# Patient Record
Sex: Female | Born: 1990 | Race: Black or African American | Hispanic: No | Smoking: Never smoker
Health system: Southern US, Community
[De-identification: ages and names within clinical notes are randomized; demographics above are authoritative.]

## PROBLEM LIST (undated history)

## (undated) ENCOUNTER — Emergency Department (HOSPITAL_COMMUNITY): Payer: Medicaid Other

## (undated) ENCOUNTER — Inpatient Hospital Stay (HOSPITAL_COMMUNITY): Payer: Self-pay

## (undated) DIAGNOSIS — D573 Sickle-cell trait: Secondary | ICD-10-CM

## (undated) DIAGNOSIS — F419 Anxiety disorder, unspecified: Secondary | ICD-10-CM

## (undated) DIAGNOSIS — F329 Major depressive disorder, single episode, unspecified: Secondary | ICD-10-CM

## (undated) DIAGNOSIS — F4310 Post-traumatic stress disorder, unspecified: Secondary | ICD-10-CM

## (undated) DIAGNOSIS — F99 Mental disorder, not otherwise specified: Secondary | ICD-10-CM

## (undated) DIAGNOSIS — F32A Depression, unspecified: Secondary | ICD-10-CM

## (undated) HISTORY — PX: NO PAST SURGERIES: SHX2092

## (undated) HISTORY — DX: Post-traumatic stress disorder, unspecified: F43.10

## (undated) HISTORY — PX: WISDOM TOOTH EXTRACTION: SHX21

---
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
## 2011-01-11 ENCOUNTER — Emergency Department (HOSPITAL_COMMUNITY)
Admission: EM | Admit: 2011-01-11 | Discharge: 2011-01-11 | Discharge status: Against Medical Advice | Payer: Self-pay | Attending: Emergency Medicine | Admitting: Emergency Medicine

## 2011-01-11 DIAGNOSIS — R6883 Chills (without fever): Secondary | ICD-10-CM | POA: Insufficient documentation

## 2011-01-11 DIAGNOSIS — R51 Headache: Secondary | ICD-10-CM | POA: Insufficient documentation

## 2011-02-24 ENCOUNTER — Inpatient Hospital Stay (HOSPITAL_COMMUNITY)
Admission: AD | Admit: 2011-02-24 | Discharge: 2011-02-24 | Disposition: A | Discharge status: Home / Self Care | Payer: Medicaid Other | Source: Ambulatory Visit | Attending: Obstetrics & Gynecology | Admitting: Obstetrics & Gynecology

## 2011-02-24 ENCOUNTER — Inpatient Hospital Stay (HOSPITAL_COMMUNITY): Payer: Medicaid Other

## 2011-02-24 ENCOUNTER — Inpatient Hospital Stay (INDEPENDENT_AMBULATORY_CARE_PROVIDER_SITE_OTHER)
Admission: RE | Admit: 2011-02-24 | Discharge: 2011-02-24 | Disposition: A | Discharge status: Home / Self Care | Payer: Self-pay | Source: Ambulatory Visit | Attending: Family Medicine | Admitting: Family Medicine

## 2011-02-24 DIAGNOSIS — O210 Mild hyperemesis gravidarum: Secondary | ICD-10-CM | POA: Insufficient documentation

## 2011-02-24 DIAGNOSIS — O9989 Other specified diseases and conditions complicating pregnancy, childbirth and the puerperium: Secondary | ICD-10-CM

## 2011-02-24 DIAGNOSIS — O99891 Other specified diseases and conditions complicating pregnancy: Secondary | ICD-10-CM | POA: Insufficient documentation

## 2011-02-24 DIAGNOSIS — Z331 Pregnant state, incidental: Secondary | ICD-10-CM

## 2011-02-24 DIAGNOSIS — R109 Unspecified abdominal pain: Secondary | ICD-10-CM

## 2011-02-24 LAB — WET PREP, GENITAL
Trich, Wet Prep: NONE SEEN
Yeast Wet Prep HPF POC: NONE SEEN

## 2011-02-24 LAB — DIFFERENTIAL
Basophils Absolute: 0 10*3/uL (ref 0.0–0.1)
Basophils Relative: 0 % (ref 0–1)
Eosinophils Absolute: 0.1 10*3/uL (ref 0.0–0.7)
Eosinophils Relative: 1 % (ref 0–5)
Lymphocytes Relative: 27 % (ref 12–46)
Lymphs Abs: 2.6 10*3/uL (ref 0.7–4.0)
Monocytes Absolute: 0.9 10*3/uL (ref 0.1–1.0)
Monocytes Relative: 9 % (ref 3–12)
Neutro Abs: 6.2 10*3/uL (ref 1.7–7.7)
Neutrophils Relative %: 64 % (ref 43–77)

## 2011-02-24 LAB — CBC
Platelets: 252 10*3/uL (ref 150–400)
RBC: 4.47 MIL/uL (ref 3.87–5.11)
WBC: 9.8 10*3/uL (ref 4.0–10.5)

## 2011-02-24 LAB — POCT URINALYSIS DIP (DEVICE)
Glucose, UA: NEGATIVE mg/dL
Hgb urine dipstick: NEGATIVE
Ketones, ur: >=160 mg/dL — AB
Nitrite: NEGATIVE
Protein, ur: 30 mg/dL — AB
Specific Gravity, Urine: >=1.03 (ref 1.005–1.030)
Urobilinogen, UA: 1 mg/dL (ref 0.0–1.0)
pH: 6.5 (ref 5.0–8.0)

## 2011-02-24 LAB — POCT PREGNANCY, URINE: Preg Test, Ur: POSITIVE

## 2011-03-02 ENCOUNTER — Inpatient Hospital Stay (HOSPITAL_COMMUNITY)
Admission: AD | Admit: 2011-03-02 | Discharge: 2011-03-02 | Disposition: A | Discharge status: Home / Self Care | Payer: Medicaid Other | Source: Ambulatory Visit | Attending: Obstetrics & Gynecology | Admitting: Obstetrics & Gynecology

## 2011-03-02 DIAGNOSIS — O210 Mild hyperemesis gravidarum: Secondary | ICD-10-CM

## 2011-03-02 LAB — URINE MICROSCOPIC-ADD ON

## 2011-03-02 LAB — COMPREHENSIVE METABOLIC PANEL
AST: 16 U/L (ref 0–37)
Albumin: 3.8 g/dL (ref 3.5–5.2)
Chloride: 99 mEq/L (ref 96–112)
Creatinine, Ser: 0.73 mg/dL (ref 0.4–1.2)
GFR calc Af Amer: >60 mL/min (ref >60)
Potassium: 4 mEq/L (ref 3.5–5.1)
Total Bilirubin: 0.4 mg/dL (ref 0.3–1.2)
Total Protein: 7.4 g/dL (ref 6.0–8.3)

## 2011-03-02 LAB — URINALYSIS, ROUTINE W REFLEX MICROSCOPIC
Hgb urine dipstick: NEGATIVE
Specific Gravity, Urine: >1.03 — ABNORMAL HIGH (ref 1.005–1.030)
pH: 6 (ref 5.0–8.0)

## 2011-03-02 LAB — HIV ANTIBODY (ROUTINE TESTING W REFLEX): HIV: NONREACTIVE

## 2011-03-02 LAB — CBC
MCH: 29.6 pg (ref 26.0–34.0)
Platelets: 238 10*3/uL (ref 150–400)
RBC: 4.49 MIL/uL (ref 3.87–5.11)
RDW: 12.9 % (ref 11.5–15.5)
WBC: 7.1 10*3/uL (ref 4.0–10.5)

## 2011-03-22 ENCOUNTER — Emergency Department (HOSPITAL_COMMUNITY)
Admission: EM | Admit: 2011-03-22 | Discharge: 2011-03-22 | Disposition: A | Discharge status: Home / Self Care | Payer: Medicaid Other | Attending: Emergency Medicine | Admitting: Emergency Medicine

## 2011-03-22 DIAGNOSIS — M545 Low back pain, unspecified: Secondary | ICD-10-CM | POA: Insufficient documentation

## 2011-03-22 DIAGNOSIS — O99891 Other specified diseases and conditions complicating pregnancy: Secondary | ICD-10-CM | POA: Insufficient documentation

## 2011-03-22 LAB — URINALYSIS, ROUTINE W REFLEX MICROSCOPIC
Bilirubin Urine: NEGATIVE
Glucose, UA: NEGATIVE mg/dL
Hgb urine dipstick: NEGATIVE
Specific Gravity, Urine: 1.02 (ref 1.005–1.030)
Urobilinogen, UA: 0.2 mg/dL (ref 0.0–1.0)

## 2011-04-16 LAB — ABO/RH

## 2011-04-16 LAB — STREP B DNA PROBE: GBS: NEGATIVE

## 2011-04-16 LAB — RUBELLA ANTIBODY, IGM: Rubella: IMMUNE

## 2011-08-06 ENCOUNTER — Encounter (HOSPITAL_COMMUNITY): Payer: Self-pay | Admitting: *Deleted

## 2011-08-06 ENCOUNTER — Inpatient Hospital Stay (HOSPITAL_COMMUNITY)
Admission: AD | Admit: 2011-08-06 | Discharge: 2011-08-06 | Disposition: A | Discharge status: Home / Self Care | Payer: Medicaid Other | Source: Ambulatory Visit | Attending: Obstetrics & Gynecology | Admitting: Obstetrics & Gynecology

## 2011-08-06 ENCOUNTER — Inpatient Hospital Stay (HOSPITAL_COMMUNITY): Payer: Medicaid Other

## 2011-08-06 DIAGNOSIS — R102 Pelvic and perineal pain: Secondary | ICD-10-CM

## 2011-08-06 DIAGNOSIS — N949 Unspecified condition associated with female genital organs and menstrual cycle: Secondary | ICD-10-CM

## 2011-08-06 DIAGNOSIS — O99891 Other specified diseases and conditions complicating pregnancy: Secondary | ICD-10-CM | POA: Insufficient documentation

## 2011-08-06 DIAGNOSIS — O26899 Other specified pregnancy related conditions, unspecified trimester: Secondary | ICD-10-CM

## 2011-08-06 HISTORY — DX: Anxiety disorder, unspecified: F41.9

## 2011-08-06 HISTORY — DX: Mental disorder, not otherwise specified: F99

## 2011-08-06 LAB — URINALYSIS, ROUTINE W REFLEX MICROSCOPIC
Glucose, UA: NEGATIVE mg/dL
Hgb urine dipstick: NEGATIVE
Leukocytes, UA: NEGATIVE
Specific Gravity, Urine: 1.015 (ref 1.005–1.030)
pH: 6.5 (ref 5.0–8.0)

## 2011-08-06 NOTE — ED Provider Notes (Signed)
History     Chief Complaint  Patient presents with  . Laboring   HPI 20 y.o. G1P0 at [redacted]w[redacted]d c/o pelvic pressure and sharp suprapubic/vaginal pain x 5 days. + fetal movement. No vaginal bleeding or LOF. Uncomplicated prenatal course.     Past Medical History  Diagnosis Date  . Anxiety   . Mental disorder     depression    Past Surgical History  Procedure Date  . No past surgeries     No family history on file.  History  Substance Use Topics  . Smoking status: Never Smoker   . Smokeless tobacco: Not on file  . Alcohol Use: No    Allergies: No Known Allergies  Prescriptions prior to admission  Medication Sig Dispense Refill  . escitalopram (LEXAPRO) 20 MG tablet Take 20 mg by mouth daily.        . fluconazole (DIFLUCAN) 150 MG tablet Take 150 mg by mouth once.        . nystatin-triamcinolone (MYCOLOG II) cream Apply 1 application topically at bedtime.        . ondansetron (ZOFRAN) 8 MG tablet Take by mouth every 8 (eight) hours as needed. For nausea       . prenatal vitamin w/FE, FA (PRENATAL 1 + 1) 27-1 MG TABS Take 1 tablet by mouth daily.          Review of Systems  Constitutional: Negative.   Respiratory: Negative.   Cardiovascular: Negative.   Gastrointestinal: Positive for abdominal pain. Negative for nausea, vomiting, diarrhea and constipation.  Genitourinary: Negative for dysuria, urgency, frequency, hematuria and flank pain.       Negative for vaginal bleeding, cramping/contractions  Musculoskeletal: Negative.   Neurological: Negative.   Psychiatric/Behavioral: Negative.    Physical Exam   Blood pressure 120/65, pulse 88, temperature 98.2 F (36.8 C), temperature source Oral, resp. rate 20, height 5\' 5"  (1.651 m), weight 78.019 kg (172 lb), SpO2 99.00%.  Physical Exam  Nursing note and vitals reviewed. Constitutional: She is oriented to person, place, and time. She appears well-developed and well-nourished. No distress.  Cardiovascular: Normal rate.    Respiratory: Effort normal.  GI: Soft. There is no tenderness.  Genitourinary: No bleeding around the vagina. Vaginal discharge (white, creamy, nonmalodorous) found.       SVE: closed/thick/high  Musculoskeletal: Normal range of motion.  Neurological: She is alert and oriented to person, place, and time.  Skin: Skin is warm and dry.  Psychiatric: She has a normal mood and affect.   EFM: Baseline:150  Variability:mod Accels:none Decels:none  Toco:quiet  MAU Course  Procedures  Results for orders placed during the hospital encounter of 08/06/11 (from the past 24 hour(s))  URINALYSIS, ROUTINE W REFLEX MICROSCOPIC     Status: Normal   Collection Time   08/06/11  8:05 PM      Component Value Range   Color, Urine YELLOW  YELLOW    Appearance CLEAR  CLEAR    Specific Gravity, Urine 1.015  1.005 - 1.030    pH 6.5  5.0 - 8.0    Glucose, UA NEGATIVE  NEGATIVE (mg/dL)   Hgb urine dipstick NEGATIVE  NEGATIVE    Bilirubin Urine NEGATIVE  NEGATIVE    Ketones, ur NEGATIVE  NEGATIVE (mg/dL)   Protein, ur NEGATIVE  NEGATIVE (mg/dL)   Urobilinogen, UA 1.0  0.0 - 1.0 (mg/dL)   Nitrite NEGATIVE  NEGATIVE    Leukocytes, UA NEGATIVE  NEGATIVE    BPP 8/8  Assessment and  Plan  20 y.o. G1P0 at [redacted]w[redacted]d Pelvic pain F/U as scheduled, precautions rev'd   [name] 08/06/2011, 8:58 PM

## 2011-08-06 NOTE — Progress Notes (Signed)
Pt states sthat she has apd pain that comes and goes in the back and in the front-present since Sunday

## 2011-08-06 NOTE — Progress Notes (Signed)
Pt in c/o pelvic pressure since Sunday, worse today.  Reports sharp pains in lower abdomen since Sunday, worse today.  Was seen at office on Monday, being treated for a yeast infection.  Denies any bleeding or leaking of fluid.  Decreased fetal movement today.  Did not have cervix checked on office on Monday.

## 2011-08-07 NOTE — ED Provider Notes (Signed)
Agree with above note.  LEGGETT,KELLY H. 08/07/2011 6:33 AM  

## 2011-10-11 ENCOUNTER — Encounter (HOSPITAL_COMMUNITY): Payer: Self-pay | Admitting: *Deleted

## 2011-10-11 ENCOUNTER — Inpatient Hospital Stay (HOSPITAL_COMMUNITY)
Admission: AD | Admit: 2011-10-11 | Discharge: 2011-10-11 | Disposition: A | Discharge status: Home / Self Care | Payer: Medicaid Other | Source: Ambulatory Visit | Attending: Obstetrics & Gynecology | Admitting: Obstetrics & Gynecology

## 2011-10-11 DIAGNOSIS — R109 Unspecified abdominal pain: Secondary | ICD-10-CM | POA: Insufficient documentation

## 2011-10-11 DIAGNOSIS — O4690 Antepartum hemorrhage, unspecified, unspecified trimester: Secondary | ICD-10-CM | POA: Insufficient documentation

## 2011-10-11 HISTORY — DX: Major depressive disorder, single episode, unspecified: F32.9

## 2011-10-11 HISTORY — DX: Depression, unspecified: F32.A

## 2011-10-12 ENCOUNTER — Encounter (HOSPITAL_COMMUNITY): Payer: Self-pay | Admitting: Anesthesiology

## 2011-10-12 ENCOUNTER — Inpatient Hospital Stay (HOSPITAL_COMMUNITY): Payer: Medicaid Other | Admitting: Anesthesiology

## 2011-10-12 ENCOUNTER — Encounter (HOSPITAL_COMMUNITY): Payer: Self-pay | Admitting: *Deleted

## 2011-10-12 ENCOUNTER — Inpatient Hospital Stay (HOSPITAL_COMMUNITY)
Admission: AD | Admit: 2011-10-12 | Discharge: 2011-10-15 | DRG: 775 | Disposition: A | Discharge status: Home / Self Care | Payer: Medicaid Other | Source: Ambulatory Visit | Attending: Obstetrics & Gynecology | Admitting: Obstetrics & Gynecology

## 2011-10-12 DIAGNOSIS — F329 Major depressive disorder, single episode, unspecified: Secondary | ICD-10-CM

## 2011-10-12 DIAGNOSIS — O99344 Other mental disorders complicating childbirth: Secondary | ICD-10-CM

## 2011-10-12 DIAGNOSIS — F1290 Cannabis use, unspecified, uncomplicated: Secondary | ICD-10-CM | POA: Clinically undetermined

## 2011-10-12 DIAGNOSIS — Z7289 Other problems related to lifestyle: Secondary | ICD-10-CM | POA: Diagnosis not present

## 2011-10-12 LAB — CBC
Hemoglobin: 11.7 g/dL — ABNORMAL LOW (ref 12.0–15.0)
MCH: 30.2 pg (ref 26.0–34.0)
Platelets: 176 10*3/uL (ref 150–400)
RBC: 3.88 MIL/uL (ref 3.87–5.11)
WBC: 7.6 10*3/uL (ref 4.0–10.5)

## 2011-10-12 MED ORDER — LACTATED RINGERS IV SOLN: 500.0000 mL | INTRAVENOUS | Status: DC | PRN

## 2011-10-12 MED ORDER — FENTANYL 2.5 MCG/ML BUPIVACAINE 1/10 % EPIDURAL INFUSION (WH - ANES)
INTRAMUSCULAR | Status: DC | PRN
  Administered 2011-10-12: 14 mL/h via EPIDURAL

## 2011-10-12 MED ORDER — PHENYLEPHRINE 40 MCG/ML (10ML) SYRINGE FOR IV PUSH (FOR BLOOD PRESSURE SUPPORT)
80.0000 ug | PREFILLED_SYRINGE | INTRAVENOUS | Status: DC | PRN
  Filled 2011-10-12: qty 5

## 2011-10-12 MED ORDER — ACETAMINOPHEN 325 MG PO TABS: 650.0000 mg | ORAL_TABLET | ORAL | Status: DC | PRN

## 2011-10-12 MED ORDER — FENTANYL 2.5 MCG/ML BUPIVACAINE 1/10 % EPIDURAL INFUSION (WH - ANES)
14.0000 mL/h | INTRAMUSCULAR | Status: DC
  Administered 2011-10-13 (×4): 14 mL/h via EPIDURAL
  Filled 2011-10-12 (×5): qty 60

## 2011-10-12 MED ORDER — HYDROXYZINE HCL 50 MG PO TABS: 50.0000 mg | ORAL_TABLET | Freq: Four times a day (QID) | ORAL | Status: DC | PRN

## 2011-10-12 MED ORDER — OXYTOCIN BOLUS FROM INFUSION
500.0000 mL | Freq: Once | INTRAVENOUS | Status: DC
  Filled 2011-10-12: qty 500

## 2011-10-12 MED ORDER — IBUPROFEN 600 MG PO TABS: 600.0000 mg | ORAL_TABLET | Freq: Four times a day (QID) | ORAL | Status: DC | PRN

## 2011-10-12 MED ORDER — DIPHENHYDRAMINE HCL 50 MG/ML IJ SOLN: 12.5000 mg | INTRAMUSCULAR | Status: DC | PRN

## 2011-10-12 MED ORDER — OXYTOCIN 20 UNITS IN LACTATED RINGERS INFUSION - SIMPLE: 125.0000 mL/h | Freq: Once | INTRAVENOUS | Status: DC

## 2011-10-12 MED ORDER — LACTATED RINGERS IV SOLN: 500.0000 mL | Freq: Once | INTRAVENOUS | Status: DC

## 2011-10-12 MED ORDER — BUTORPHANOL TARTRATE 2 MG/ML IJ SOLN: 1.0000 mg | INTRAMUSCULAR | Status: DC | PRN

## 2011-10-12 MED ORDER — EPHEDRINE 5 MG/ML INJ
10.0000 mg | INTRAVENOUS | Status: DC | PRN
  Filled 2011-10-12: qty 4

## 2011-10-12 MED ORDER — SODIUM BICARBONATE 8.4 % IV SOLN
INTRAVENOUS | Status: DC | PRN
  Administered 2011-10-12: 4 mL via EPIDURAL

## 2011-10-12 MED ORDER — ONDANSETRON HCL 4 MG/2ML IJ SOLN: 4.0000 mg | Freq: Four times a day (QID) | INTRAMUSCULAR | Status: DC | PRN

## 2011-10-12 MED ORDER — OXYCODONE-ACETAMINOPHEN 5-325 MG PO TABS: 2.0000 | ORAL_TABLET | ORAL | Status: DC | PRN

## 2011-10-12 MED ORDER — PHENYLEPHRINE 40 MCG/ML (10ML) SYRINGE FOR IV PUSH (FOR BLOOD PRESSURE SUPPORT): 80.0000 ug | PREFILLED_SYRINGE | INTRAVENOUS | Status: DC | PRN

## 2011-10-12 MED ORDER — LIDOCAINE HCL (PF) 1 % IJ SOLN: 30.0000 mL | INTRAMUSCULAR | Status: DC | PRN

## 2011-10-12 MED ORDER — LACTATED RINGERS IV SOLN
INTRAVENOUS | Status: DC
  Administered 2011-10-13 (×2): via INTRAVENOUS

## 2011-10-12 MED ORDER — HYDROXYZINE HCL 50 MG/ML IM SOLN: 50.0000 mg | Freq: Four times a day (QID) | INTRAMUSCULAR | Status: DC | PRN

## 2011-10-12 MED ORDER — EPHEDRINE 5 MG/ML INJ: 10.0000 mg | INTRAVENOUS | Status: DC | PRN

## 2011-10-12 MED ORDER — CITRIC ACID-SODIUM CITRATE 334-500 MG/5ML PO SOLN: 30.0000 mL | ORAL | Status: DC | PRN

## 2011-10-12 NOTE — ED Notes (Signed)
Called D. Poe CNM about pts arrival for labor check. She asked that I check pt.

## 2011-10-12 NOTE — H&P (Signed)
Obstetric History and Physical  [name] is a 20 y.o. female G1P0 with IUP at [redacted]w[redacted]d presenting for early labor. Pt states she has been having irregular, every 1-5 minutes contractions, scant vaginal bleeding, intact membranes, with active fetal movement.    Prenatal Course Source of Care:[facility]  with onset of care at 12 weeks Pregnancy complications or risks: Depression, on Lexapro  She desires to oral contraceptives (estrogen/progesterone).  She plans to breastfeed She plans outpatient circumcision for her female infant  Prenatal labs and studies: ABO, Rh:  A+ Antibody:  neg Rubella:  immune RPR:   NR HBsAg:   neg HIV:   neg GBS:   neg 2 hr Glucola normal Genetic screeningnormal Anatomy US normal  Past Medical History:  Past Medical History  Diagnosis Date  . Mental disorder     depression  . Depression     Past Surgical History:  Past Surgical History  Procedure Date  . No past surgeries     Obstetrical History:  OB History    Grav Para Term Preterm Abortions TAB SAB Ect Mult Living   1               Gynecological History: Noncontributory  Social History:  History   Social History  . Marital Status: Single    Spouse Name: N/A    Number of Children: N/A  . Years of Education: N/A   Social History Main Topics  . Smoking status: Never Smoker   . Smokeless tobacco: None  . Alcohol Use: No  . Drug Use: No  . Sexually Active: Yes    Birth Control/ Protection: None   Other Topics Concern  . None   Social History Narrative  . None    Family History:  Family History  Problem Relation Age of Onset  . Anesthesia problems Neg Hx   . Hypotension Neg Hx   . Malignant hyperthermia Neg Hx   . Pseudochol deficiency Neg Hx     Medications:  Prenatal vitamins,  No current facility-administered medications for this encounter.    Allergies: No Known Allergies  Review of Systems: - Negative  Physical Exam: Blood pressure 127/76, pulse  81, temperature 98.7 F (37.1 C), temperature source Oral, resp. rate 16, height 5\' 5"  (1.651 m), weight 86.41 kg (190 lb 8 oz). GENERAL: Well-developed, well-nourished female in no acute distress.  LUNGS: Clear to auscultation bilaterally.  HEART: Regular rate and rhythm. ABDOMEN: Soft, nontender, nondistended, gravid. EXTREMITIES: Nontender, no edema, 2+ distal pulses. Cervical Exam: Dilatation 4cm   Effacement 100%   Station -1   Presentation: cephalic FHT:  Baseline rate 150 bpm   Variability moderate  Accelerations present   Decelerations none Contractions: Every 1-5 mins   Assessment : [name] is a 20 y.o. G1P0 at [redacted]w[redacted]d being admitted for labor.  Plan: Labor: Expectant management.  Induction/Augmentation as needed, per protocol FWB: Reassuring fetal heart tracing.  GBS negative Delivery plan: Hopeful for vaginal delivery   Jaynie Collins, M.D. 10/12/2011 9:21 PM

## 2011-10-12 NOTE — Progress Notes (Signed)
Pt states ctx's since yesterday, back pain worsened today, had bloody show, vaginal exam 2cm in MAU, +FM, denies lof.

## 2011-10-12 NOTE — Anesthesia Preprocedure Evaluation (Signed)

## 2011-10-12 NOTE — ED Notes (Signed)
DChristy Gentles CNM notified by phone of cx 2/100/vt/-2 with bulging bag. Tracing reactive. Plan, will walk for one hour and recheck.

## 2011-10-12 NOTE — H&P (Deleted)
[name] is a 20 y.o. female presenting for contractions. She denies vaginal bleeding or leaking of fluid.  Maternal Medical History:  Reason for admission: Reason for admission: contractions.  Reason for Admission:   nauseaContractions: Frequency: regular.   Perceived severity is moderate.    Fetal activity: Perceived fetal activity is normal.   Last perceived fetal movement was within the past hour.      OB History    Grav Para Term Preterm Abortions TAB SAB Ect Mult Living   1              Past Medical History  Diagnosis Date  . Mental disorder     depression  . Depression    Past Surgical History  Procedure Date  . No past surgeries     Patient Active Problem List  Diagnoses  . Depressed  . Marijuana smoker  . Deliberate self-cutting    Family History: family history is negative for Anesthesia problems, and Hypotension, and Malignant hyperthermia, and Pseudochol deficiency, . Social History:  reports that she has never smoked. She does not have any smokeless tobacco history on file. She reports that she does not drink alcohol or use illicit drugs.  Review of Systems  Constitutional: Negative for fever and chills.  Eyes: Negative for blurred vision.  Gastrointestinal: Positive for abdominal pain (contractions). Negative for heartburn, nausea and vomiting.  Neurological: Positive for headaches.  Psychiatric/Behavioral: Negative for depression.    Dilation: 4 Effacement (%): 100 Station: -1 Exam by:: [name] CNM Blood pressure 127/76, pulse 81, temperature 98.7 F (37.1 C), temperature source Oral, resp. rate 16, height 5\' 5"  (1.651 m), weight 86.41 kg (190 lb 8 oz). Maternal Exam:  Uterine Assessment: Contraction strength is moderate.  Contraction frequency is regular.   Abdomen: Fundal height is S=D.   Fetal presentation: vertex  Introitus: Normal vulva. Normal vagina.  Pelvis: adequate for delivery.   Cervix: Cervix evaluated by digital  exam.     Fetal Exam Fetal Monitor Review: Mode: ultrasound.   Baseline rate: 140.  Variability: moderate (6-25 bpm).   Pattern: accelerations present and no decelerations.    Fetal State Assessment: Category I - tracings are normal.     Physical Exam  Nursing note and vitals reviewed. Constitutional: She is oriented to person, place, and time. She appears well-developed and well-nourished. She appears distressed.  HENT:  Head: Normocephalic.  Eyes: Pupils are equal, round, and reactive to light.  Cardiovascular: Normal rate and regular rhythm.   Respiratory: Effort normal and breath sounds normal.  GI: Soft. There is no tenderness.  Genitourinary: Vagina normal and uterus normal.  Musculoskeletal: Normal range of motion.  Neurological: She is alert and oriented to person, place, and time. She has normal reflexes.  Skin: Skin is warm and dry.  Psychiatric: She has a normal mood and affect.    Prenatal labs: ABO, Rh:  A pos Antibody:  Neg Rubella:  Immune RPR:   NR HBsAg:   Neg HIV:   NR GBS:   Neg 2 hour GTT normal Quad normal  Assessment: 1. Labor: Active 2. Fetal Wellbeing: Category 1  3. Pain Control: plans epidural 4. GBS: neg 5. 38.5 week IUP  Plan:  1. Admit to BS per consult with MD 2. Routine L&D orders 3. Analgesia/anesthesia PRN  4. UDS 5. SW consult PP  [name] 10/12/2011, 9:26 PM

## 2011-10-12 NOTE — H&P (Signed)
Attestation of Attending Supervision of Advanced Practitioner: Evaluation and management procedures were performed by the PA/NP/CNM/OB Fellow under my supervision/collaboration. Chart reviewed, and agree with management and plan.  Jaynie Collins, M.D. 10/12/2011 11:15 PM

## 2011-10-12 NOTE — Anesthesia Procedure Notes (Signed)

## 2011-10-12 NOTE — ED Provider Notes (Signed)
Please refer to H&P for labor admission details.  [name], M.D. 10/12/2011 9:16 PM

## 2011-10-13 ENCOUNTER — Encounter (HOSPITAL_COMMUNITY): Payer: Self-pay | Admitting: *Deleted

## 2011-10-13 LAB — RAPID URINE DRUG SCREEN, HOSP PERFORMED
Amphetamines: NOT DETECTED
Barbiturates: NOT DETECTED
Opiates: NOT DETECTED
Tetrahydrocannabinol: POSITIVE — AB

## 2011-10-13 MED ORDER — ONDANSETRON HCL 4 MG PO TABS: 4.0000 mg | ORAL_TABLET | ORAL | Status: DC | PRN

## 2011-10-13 MED ORDER — ESCITALOPRAM OXALATE 20 MG PO TABS
20.0000 mg | ORAL_TABLET | Freq: Every day | ORAL | Status: DC
  Filled 2011-10-13 (×2): qty 1

## 2011-10-13 MED ORDER — SENNOSIDES-DOCUSATE SODIUM 8.6-50 MG PO TABS
2.0000 | ORAL_TABLET | Freq: Every day | ORAL | Status: DC
  Administered 2011-10-13: 2 via ORAL

## 2011-10-13 MED ORDER — DIPHENHYDRAMINE HCL 25 MG PO CAPS: 25.0000 mg | ORAL_CAPSULE | Freq: Four times a day (QID) | ORAL | Status: DC | PRN

## 2011-10-13 MED ORDER — TETANUS-DIPHTH-ACELL PERTUSSIS 5-2.5-18.5 LF-MCG/0.5 IM SUSP
0.5000 mL | Freq: Once | INTRAMUSCULAR | Status: AC
  Administered 2011-10-15: 0.5 mL via INTRAMUSCULAR
  Filled 2011-10-13: qty 0.5

## 2011-10-13 MED ORDER — TERBUTALINE SULFATE 1 MG/ML IJ SOLN: 0.2500 mg | Freq: Once | INTRAMUSCULAR | Status: DC | PRN

## 2011-10-13 MED ORDER — WITCH HAZEL-GLYCERIN EX PADS: 1.0000 "application " | MEDICATED_PAD | CUTANEOUS | Status: DC | PRN

## 2011-10-13 MED ORDER — BENZOCAINE-MENTHOL 20-0.5 % EX AERO
1.0000 "application " | INHALATION_SPRAY | CUTANEOUS | Status: DC | PRN
  Administered 2011-10-14: 1 via TOPICAL

## 2011-10-13 MED ORDER — ZOLPIDEM TARTRATE 5 MG PO TABS: 5.0000 mg | ORAL_TABLET | Freq: Every evening | ORAL | Status: DC | PRN

## 2011-10-13 MED ORDER — IBUPROFEN 600 MG PO TABS
600.0000 mg | ORAL_TABLET | Freq: Four times a day (QID) | ORAL | Status: DC
  Administered 2011-10-14 – 2011-10-15 (×6): 600 mg via ORAL
  Filled 2011-10-13 (×6): qty 1

## 2011-10-13 MED ORDER — ONDANSETRON HCL 4 MG/2ML IJ SOLN: 4.0000 mg | INTRAMUSCULAR | Status: DC | PRN

## 2011-10-13 MED ORDER — OXYTOCIN 20 UNITS IN LACTATED RINGERS INFUSION - SIMPLE
1.0000 m[IU]/min | INTRAVENOUS | Status: DC
  Administered 2011-10-13: 2 m[IU]/min via INTRAVENOUS
  Filled 2011-10-13: qty 1000

## 2011-10-13 MED ORDER — SIMETHICONE 80 MG PO CHEW: 80.0000 mg | CHEWABLE_TABLET | ORAL | Status: DC | PRN

## 2011-10-13 MED ORDER — OXYCODONE-ACETAMINOPHEN 5-325 MG PO TABS: 1.0000 | ORAL_TABLET | ORAL | Status: DC | PRN

## 2011-10-13 MED ORDER — PRENATAL MULTIVITAMIN CH
1.0000 | ORAL_TABLET | Freq: Every day | ORAL | Status: DC
  Administered 2011-10-14 – 2011-10-15 (×2): 1 via ORAL
  Filled 2011-10-13 (×2): qty 1

## 2011-10-13 MED ORDER — INFLUENZA VIRUS VACC SPLIT PF IM SUSP
0.5000 mL | INTRAMUSCULAR | Status: AC
  Administered 2011-10-14: 0.5 mL via INTRAMUSCULAR
  Filled 2011-10-13: qty 0.5

## 2011-10-13 MED ORDER — LANOLIN HYDROUS EX OINT: TOPICAL_OINTMENT | CUTANEOUS | Status: DC | PRN

## 2011-10-13 MED ORDER — DIBUCAINE 1 % RE OINT: 1.0000 "application " | TOPICAL_OINTMENT | RECTAL | Status: DC | PRN

## 2011-10-13 NOTE — Progress Notes (Signed)
KENDALYN CRANFIELD is a 21 y.o. G1P0 at [redacted]w[redacted]d  Subjective: Comfortable w/ epidural  Objective: BP 116/80  Pulse 78  Temp(Src) 98.8 F (37.1 C) (Oral)  Resp 18  Ht 5\' 5"  (1.651 m)  Wt 86.183 kg (190 lb)  BMI 31.62 kg/m2  SpO2 100%      FHT:  FHR: 135 bpm, variability: moderate,  accelerations:  Present,  decelerations:  Absent UC:   regular, every 2-4 minutes SVE:   Dilation: 7 Effacement (%): 100 Station: 0 Exam by:: [employer]  Labs:  Assessment / Plan: Spontaneous labor, progressing normally  Labor: Progressing normally Preeclampsia:  NA Fetal Wellbeing:  Category I Pain Control:  Epidural I/D:  n/a Anticipated MOD:  NSVD  Xayvier Vallez 10/13/2011, 3:26 AM

## 2011-10-13 NOTE — L&D Delivery Note (Addendum)
Delivery Note At 3:47 PM a viable female was delivered via Vaginal, Spontaneous Delivery (Presentation: Right Occiput Anterior).  APGAR: 5, 9; weight .   Placenta status: Intact, Spontaneous.  Cord: 3 vessels with the following complications: None.  Cord pH: 7.03 Required bulb and N-G suction and blow-by O2. End stage meconium watery yellow and formed. Placenta appears intact, 3VC.  Anesthesia: Epidural  Episiotomy: None Lacerations: None  Est. Blood Loss (mL): 300  Mom to postpartum.  Baby to nursery-stable. Plans breastfeed, circ, OCPs.  Makaiyah Schweiger 10/13/2011, 4:10 PM

## 2011-10-13 NOTE — Progress Notes (Signed)
[name] is a 21 y.o. G1P0 at [redacted]w[redacted]d  Subjective: Comfortable w/ epidural  Objective: BP 116/80  Pulse 78  Temp(Src) 98.8 F (37.1 C) (Oral)  Resp 18  Ht 5\' 5"  (1.651 m)  Wt 86.183 kg (190 lb)  BMI 31.62 kg/m2  SpO2 100%      FHT:  FHR: 135 bpm, variability: moderate,  accelerations:  Present,  decelerations:  Absent UC:   regular, every 2-4 minutes SVE:   Dilation: 5.5 Effacement (%): 100 Station: -1 Exam by:: RN  Labs:  Assessment / Plan: Spontaneous labor, progressing normally  Labor: Progressing normally Preeclampsia:  NA Fetal Wellbeing:  Category I Pain Control:  Epidural I/D:  n/a Anticipated MOD:  NSVD  [name] 10/13/2011, 1:24 AM

## 2011-10-13 NOTE — Progress Notes (Signed)
Patient ID: BRAXTON WEISBECKER, female   DOB: Oct 14, 1990, 21 y.o.   MRN: [id number] NEWELL FRATER is a 21 y.o. G1P0 at [redacted]w[redacted]d, admitted for spontaneous onset of early labor.  Subjective: Comfortable and rested since epidural placed. Per RN SROM at 0530.  Objective: BP 92/45  Pulse 110  Temp(Src) 98.8 F (37.1 C) (Oral)  Resp 20  Ht 5\' 5"  (1.651 m)  Wt 86.183 kg (190 lb)  BMI 31.62 kg/m2  SpO2 100%  Fetal Heart Rate: 140 Variability: moderate Accelerations: present   Decelerations: absent  Contractions: irregular q few mionutes but not tracing well.  SVE:   Dilation: 7.5 Effacement (%): 90 Station: 0;+1 Exam by:: Enis Slipper, RN   VE: 6-7/80/-2 LOT AROM- > moderate amount clear AF. IUPC placed  Pitocin: none Mag: n/a   Assessment / Plan: 21 y.o. G1P0 at [redacted]w[redacted]d here for labor at term  Labor: protracted active phase with inadequate UCs Preeclampsia:  n/a Fetal Wellbeing: Category 1 Pain Control:  epidural I/D:  N/a  Will begin pitocin augmentation  Bernardine Langworthy 10/13/2011, 9:22 AM

## 2011-10-13 NOTE — Progress Notes (Signed)
[name] is a 21 y.o. G1P0 at [redacted]w[redacted]d   Subjective: Comfortable w/ epidural  Objective: BP 115/71  Pulse 65  Temp(Src) 98.8 F (37.1 C) (Oral)  Resp 18  Ht 5\' 5"  (1.651 m)  Wt 86.183 kg (190 lb)  BMI 31.62 kg/m2  SpO2 100%     FHT:  FHR: 145 bpm, variability: moderate,  accelerations:  Present,  decelerations:  Absent UC:   regular, every 3-4 minutes SVE:   Dilation: 5 Effacement (%): 100 Station: -1 Exam by::  Dorathy Kinsman)  Labs: Lab Results  Component Value Date   WBC 7.6 10/12/2011   HGB 11.7* 10/12/2011   HCT 34.9* 10/12/2011   MCV 89.9 10/12/2011   PLT 176 10/12/2011    Assessment / Plan: Spontaneous labor, progressing normally  Labor: Progressing normally Preeclampsia:  NA Fetal Wellbeing:  Category I Pain Control:  Epidural I/D:  n/a Anticipated MOD:  NSVD  Casi Westerfeld 10/12/2011, 11:40 PM

## 2011-10-13 NOTE — Progress Notes (Signed)
Patient ID: [name], female   DOB: 1991/03/14, 21 y.o.   MRN: [id number] [name] is a 21 y.o. G1P0000 at [redacted]w[redacted]d, admitted for labor  Subjective: No urge top push. Tired.  Objective: BP 140/85  Pulse 112  Temp(Src) 98.4 F (36.9 C) (Oral)  Resp 20  Ht 5\' 5"  (1.651 m)  Wt 86.183 kg (190 lb)  BMI 31.62 kg/m2  SpO2 100%  Fetal Heart Rate: 135-140 Variability: moderate Accelerations: present Decelerations: early  Contractions: q 3-4  SVE:   Dilation: 10 Effacement (%): 100 Station: +1 (caput +2) Exam by:: Enis Slipper, RN  C/C/+1 to +2 with large caput , clear fluid, ?ROP Pitocin: 6 mu    Assessment / Plan: 21 y.o. G1P0000 at [redacted]w[redacted]d   Labor: passive 2nd stage  Fetal Wellbeing: Cat2 Pain Control:  adequate  Reposition and labor down  Christorpher Hisaw 10/13/2011, 2:07 PM

## 2011-10-13 NOTE — L&D Delivery Note (Signed)
Delivery Note At 3:32 AM a viable female was delivered via Vaginal, Spontaneous Delivery (Presentation: LOA  ).  APGAR: pending ; weight pending .   Placenta status: spont, intact.  FF with massage.  Anesthesia:  epid Episiotomy: none Lacerations: none Est. Blood Loss (mL): 250  Mom to postpartum.  Baby to nursery-stable.  Cam Hai 09/06/2012, 3:45 AM

## 2011-10-14 MED ORDER — BENZOCAINE-MENTHOL 20-0.5 % EX AERO
INHALATION_SPRAY | CUTANEOUS | Status: AC
  Administered 2011-10-14: 1 via TOPICAL
  Filled 2011-10-14: qty 56

## 2011-10-14 NOTE — Anesthesia Postprocedure Evaluation (Signed)
Anesthesia Post Note  Patient: [name]  Procedure(s) Performed: * No procedures listed *  Anesthesia type: Epidural  Patient location: Mother/Baby  Post pain: Pain level controlled  Post assessment: Post-op Vital signs reviewed  Last Vitals:  Filed Vitals:   10/14/11 0535  BP: 112/75  Pulse: 67  Temp: 36.8 C  Resp: 18    Post vital signs: Reviewed  Level of consciousness: awake  Complications: No apparent anesthesia complications

## 2011-10-14 NOTE — Progress Notes (Signed)
Post Partum Day 1 Subjective: no complaints, up ad lib, voiding, tolerating PO and breastfeeding well.   Objective: Blood pressure 112/75, pulse 67, temperature 98.3 F (36.8 C), temperature source Oral, resp. rate 18, height 5\' 5"  (1.651 m), weight 86.183 kg (190 lb), SpO2 94.00%, unknown if currently breastfeeding.  Physical Exam:  General: alert, cooperative, appears stated age, fatigued and no distress Lochia: appropriate Uterine Fundus: firm Incision: n/a DVT Evaluation: No evidence of DVT seen on physical exam.   Basename 10/12/11 2135  HGB 11.7*  HCT 34.9*    Assessment/Plan: Plan for discharge tomorrow Lactation consult and SW consult due to teen  LOS: 2 days   POE,DEIRDRE 10/14/2011, 8:07 AM

## 2011-10-14 NOTE — Progress Notes (Signed)
UR chart review completed.  

## 2011-10-14 NOTE — Addendum Note (Signed)
Addendum  created 10/14/11 0815 by Doreene Burke   Modules edited:Charges VN, Notes Section

## 2011-10-15 MED ORDER — IBUPROFEN 600 MG PO TABS: 600.0000 mg | ORAL_TABLET | Freq: Four times a day (QID) | ORAL | Status: AC | PRN

## 2011-10-15 MED ORDER — PRENATAL MULTIVITAMIN CH: 1.0000 | ORAL_TABLET | Freq: Every day | ORAL | Status: DC

## 2011-10-15 NOTE — Progress Notes (Signed)
PSYCHOSOCIAL ASSESSMENT ~ MATERNAL/CHILD  Name: [name]. Age: 21  Referral Date: 10/15/11  Reason/Source: History of MJ use, depression and SI /CN  I. FAMILY/HOME ENVIRONMENT  A. Child's Legal Guardian _X__Parent(s) ___Grandparent ___Foster parent ___DSS_________________  Name: [name] DOB: // Age: 69  Address: [address] [place] ; [place], [place] [address]  Name: [name], Sr. DOB: // Age:  Address: (same as above)  B. Other Household Members/Support Persons Name: Relationship: aunt DOB ___/___/___  Name: Relationship: DOB ___/___/___  Name: Relationship: DOB ___/___/___  Name: Relationship: DOB ___/___/___  C. Other Support:  II. PSYCHOSOCIAL DATA A. Information Source _X_Patient Interview __Family Interview __Other___________ B. Event organiser __Employment:  _X_Medicaid [place]: __Private Insurance: __Self Pay  __Food Stamps*pending __WIC __Work First __Public Housing __Section 8  __Maternity Care Coordination/Child Service Coordination/Early Intervention  ___School: Grade:  __Other:  [name] Cultural and Environment Information Cultural Issues Impacting Care:  III. STRENGTHS __X_Supportive family/friends  _X__Adequate Resources  ___Compliance with medical plan  _X__Home prepared for Child (including basic supplies)  ___Understanding of illness  ___Other:  RISK FACTORS AND CURRENT PROBLEMS ____No Problems Noted  History of MJ use  Depression and SI  IV. SOCIAL WORK ASSESSMENT Sw met with 28 year old, G1P1 referred for history of MJ use and depression. Pt admits to smoking MJ "once a month" prior to positive UPT at 5 weeks. Once she learned of pregnancy, she reports stopping all use but continued to be around it " a lot." Sw informed the pt of positive UDS screen. She told Sw that she last smoked in May and was always around it as an explanation for results. She denies other illegal substance use. Meconium results are pending. Sw  advised pt that a CPS report would be made as a result of drug screen results. Pt does acknowledge some depression symptoms, stating she went to group meeting at [facility] (once). She was prescribed Lexapro of which she did take until 6 month of pregnancy. She plans to restart medication once discharged. She denies any recent SI. FOB is at the bedside and supportive. Sw observed the pt bonding well with the infant in appropriate manner. She has all the supplies she needs for the infant. Sw will continue to follow and assist until discharged as needed.  V. SOCIAL WORK PLAN __X_No Further Intervention Required/No Barriers to Discharge  ___Psychosocial Support and Ongoing Assessment of Needs  ___Patient/Family Education:  _X__Child Protective Services Report County: [place] Date: 10/15/11  ___Information/Referral to [employer]_________________________  ___Other:

## 2011-10-15 NOTE — Discharge Summary (Signed)
Obstetric Discharge Summary Reason for Admission: onset of labor Prenatal Procedures: amniocentesis and ultrasound, eval for self mutilation, resolved symptoms. Intrapartum Procedures: spontaneous vaginal delivery Postpartum Procedures: none Complications-Operative and Postpartum: none no evidence postpartum depression Hemoglobin  Date Value Range Status  10/12/2011 11.7* 12.0-15.0 (g/dL) Final     HCT  Date Value Range Status  10/12/2011 34.9* 36.0-46.0 (%) Final    Discharge Diagnoses: Term Pregnancy-delivered  Discharge Information: Date: 10/15/2011 Activity: pelvic rest Diet: routine Medications: PNV and Ibuprofen Condition: stable and improved Instructions: refer to practice specific booklet Discharge to: home living with FOB and his family, they appear well supported and working together Follow-up Information    Follow up with Tilda Burrow, MD in 4 weeks. (or earlier  as needed if symptoms worsen)    Contact information:   [facility] [address] [place] [address] [phone]          Newborn Data: Live born female  Birth Weight: 7 lb 6.7 oz (3365 g) APGAR: 5, 9  Home with mother.  Weiland Tomich V 10/15/2011, 7:49 AM

## 2011-10-15 NOTE — Progress Notes (Signed)
Post Partum Day 2 Subjective: no complaints, up ad lib, tolerating PO, + flatus and breast fdg, to go to ocp's after 6 months, barrier til then  Objective: Blood pressure 132/84, pulse 77, temperature 99.1 F (37.3 C), temperature source Oral, resp. rate 20, height 5\' 5"  (1.651 m), weight 86.183 kg (190 lb), SpO2 94.00%, unknown if currently breastfeeding.  Physical Exam:  General: alert, cooperative, no distress and showing appropriate interest in baby, denies blues,  Lochia: appropriate Uterine Fundus: firm Incision: none DVT Evaluation: No evidence of DVT seen on physical exam.   Basename 10/12/11 2135  HGB 11.7*  HCT 34.9*    Assessment/Plan: Discharge home, Breastfeeding and Contraception barrier til discontinues breast feeding   LOS: 3 days   Nicolo Tomko V 10/15/2011, 7:37 AM

## 2011-12-18 ENCOUNTER — Emergency Department (INDEPENDENT_AMBULATORY_CARE_PROVIDER_SITE_OTHER)
Admission: EM | Admit: 2011-12-18 | Discharge: 2011-12-18 | Disposition: A | Discharge status: Home Health | Payer: Medicaid Other | Source: Home / Self Care | Attending: Family Medicine | Admitting: Family Medicine

## 2011-12-18 ENCOUNTER — Encounter (HOSPITAL_COMMUNITY): Payer: Self-pay

## 2011-12-18 DIAGNOSIS — J069 Acute upper respiratory infection, unspecified: Secondary | ICD-10-CM

## 2011-12-18 DIAGNOSIS — H109 Unspecified conjunctivitis: Secondary | ICD-10-CM

## 2011-12-18 LAB — POCT URINALYSIS DIP (DEVICE)
Glucose, UA: NEGATIVE mg/dL
Ketones, ur: NEGATIVE mg/dL
Nitrite: NEGATIVE

## 2011-12-18 LAB — POCT PREGNANCY, URINE: Preg Test, Ur: NEGATIVE

## 2011-12-18 MED ORDER — IPRATROPIUM BROMIDE 0.06 % NA SOLN: 2.0000 | Freq: Four times a day (QID) | NASAL | Status: DC

## 2011-12-18 MED ORDER — ERYTHROMYCIN 5 MG/GM OP OINT: TOPICAL_OINTMENT | OPHTHALMIC | Status: DC

## 2011-12-18 NOTE — ED Provider Notes (Signed)
History     CSN: [id number]  Arrival date & time 12/18/11  4098   First MD Initiated Contact with Patient 12/18/11 [phone]      Chief Complaint  Patient presents with  . Sore Throat  . Possible Pregnancy  . Eye Problem    (Consider location/radiation/quality/duration/timing/severity/associated sxs/prior treatment) Patient is a 21 y.o. female presenting with pharyngitis and eye problem. The history is provided by the patient.  Sore Throat This is a new problem. The current episode started 2 days ago. The problem has been gradually worsening. Pertinent negatives include no chest pain, no abdominal pain and no shortness of breath. The symptoms are aggravated by swallowing.  Eye Problem  Associated symptoms include eye redness. Pertinent negatives include no photophobia.    Past Medical History  Diagnosis Date  . Mental disorder     depression  . Depression   . Depression     Past Surgical History  Procedure Date  . No past surgeries     Family History  Problem Relation Age of Onset  . Anesthesia problems Neg Hx   . Hypotension Neg Hx   . Malignant hyperthermia Neg Hx   . Pseudochol deficiency Neg Hx     History  Substance Use Topics  . Smoking status: Never Smoker   . Smokeless tobacco: Not on file  . Alcohol Use: No    OB History    Grav Para Term Preterm Abortions TAB SAB Ect Mult Living   1 1 1  0 0 0 0 0 0 1      Review of Systems  Constitutional: Negative.   HENT: Positive for congestion, rhinorrhea and postnasal drip.   Eyes: Positive for redness and itching. Negative for photophobia and visual disturbance.  Respiratory: Positive for cough. Negative for shortness of breath and wheezing.   Cardiovascular: Negative for chest pain.  Gastrointestinal: Negative.  Negative for abdominal pain.    Allergies  Review of patient's allergies indicates no known allergies.  Home Medications   Current Outpatient Rx  Name Route Sig Dispense Refill  .  ERYTHROMYCIN 5 MG/GM OP OINT  Apply 1/2  Ribbon in os tid after warm cloth soak to eye 3.5 g 0  . ESCITALOPRAM OXALATE 20 MG PO TABS Oral Take 20 mg by mouth daily.      . IPRATROPIUM BROMIDE 0.06 % NA SOLN Nasal Place 2 sprays into the nose 4 (four) times daily. 15 mL 1  . PRENATAL MULTIVITAMIN CH Oral Take 1 tablet by mouth daily. 30 tablet 3    BP 118/84  Pulse 69  Temp(Src) 98.9 F (37.2 C) (Oral)  Resp 18  SpO2 100%  Breastfeeding? No  Physical Exam  Nursing note and vitals reviewed. Constitutional: She appears well-developed and well-nourished.  HENT:  Head: Normocephalic.  Right Ear: External ear normal.  Left Ear: External ear normal.  Mouth/Throat: Mucous membranes are normal. Posterior oropharyngeal erythema present. No oropharyngeal exudate or posterior oropharyngeal edema.  Eyes: Right eye exhibits no discharge. Left eye exhibits no discharge and no exudate. Right conjunctiva is not injected. Left conjunctiva is injected.    ED Course  Procedures (including critical care time)  Labs Reviewed  POCT URINALYSIS DIP (DEVICE) - Abnormal; Notable for the following:    Bilirubin Urine SMALL (*)    Protein, ur 30 (*)    Leukocytes, UA SMALL (*) Biochemical Testing Only. Please order routine urinalysis from main lab if confirmatory testing is needed.   All other components within normal  limits  POCT PREGNANCY, URINE   No results found.   1. Conjunctivitis of left eye   2. URI (upper respiratory infection)       MDM  u preg neg.        Barkley Bruns, MD 12/18/11 [phone]

## 2011-12-18 NOTE — Discharge Instructions (Signed)
Drink plenty of fluids as discussed, use medicine as prescribed, and . Return or see your doctor if further problems °

## 2011-12-18 NOTE — ED Notes (Signed)
C/o runny nose, sore throat, and redness, drainage and itching to lt eye for 2 days.  States would like a pregnancy test- states had baby 10/13/11 and experienced spotting in Feb. But no regular period.

## 2012-01-02 ENCOUNTER — Encounter (HOSPITAL_COMMUNITY): Payer: Self-pay | Admitting: *Deleted

## 2012-01-02 ENCOUNTER — Emergency Department (HOSPITAL_COMMUNITY)
Admission: EM | Admit: 2012-01-02 | Discharge: 2012-01-02 | Disposition: A | Discharge status: Home / Self Care | Payer: Medicaid Other | Source: Home / Self Care | Attending: Emergency Medicine | Admitting: Emergency Medicine

## 2012-01-02 ENCOUNTER — Inpatient Hospital Stay (HOSPITAL_COMMUNITY): Payer: Medicaid Other

## 2012-01-02 ENCOUNTER — Inpatient Hospital Stay (HOSPITAL_COMMUNITY)
Admission: AD | Admit: 2012-01-02 | Discharge: 2012-01-02 | Disposition: A | Discharge status: Home / Self Care | Payer: Medicaid Other | Source: Ambulatory Visit | Attending: Obstetrics & Gynecology | Admitting: Obstetrics & Gynecology

## 2012-01-02 DIAGNOSIS — O209 Hemorrhage in early pregnancy, unspecified: Secondary | ICD-10-CM

## 2012-01-02 DIAGNOSIS — R109 Unspecified abdominal pain: Secondary | ICD-10-CM | POA: Insufficient documentation

## 2012-01-02 LAB — CBC
HCT: 36.1 % (ref 36.0–46.0)
Hemoglobin: 11.6 g/dL — ABNORMAL LOW (ref 12.0–15.0)
MCH: 29 pg (ref 26.0–34.0)
MCHC: 32.1 g/dL (ref 30.0–36.0)
MCV: 90.3 fL (ref 78.0–100.0)
Platelets: 243 10*3/uL (ref 150–400)
RBC: 4 MIL/uL (ref 3.87–5.11)
RDW: 13.8 % (ref 11.5–15.5)
WBC: 5.6 10*3/uL (ref 4.0–10.5)

## 2012-01-02 LAB — URINE MICROSCOPIC-ADD ON

## 2012-01-02 LAB — URINALYSIS, ROUTINE W REFLEX MICROSCOPIC
Bilirubin Urine: NEGATIVE
Glucose, UA: NEGATIVE mg/dL
Hgb urine dipstick: NEGATIVE
Ketones, ur: NEGATIVE mg/dL
Nitrite: NEGATIVE
Protein, ur: NEGATIVE mg/dL
Specific Gravity, Urine: 1.02 (ref 1.005–1.030)
Urobilinogen, UA: 0.2 mg/dL (ref 0.0–1.0)
pH: 6 (ref 5.0–8.0)

## 2012-01-02 LAB — WET PREP, GENITAL
Clue Cells Wet Prep HPF POC: NONE SEEN
Trich, Wet Prep: NONE SEEN

## 2012-01-02 LAB — POCT PREGNANCY, URINE: Preg Test, Ur: POSITIVE — AB

## 2012-01-02 LAB — HCG, QUANTITATIVE, PREGNANCY: hCG, Beta Chain, Quant, S: 1750 m[IU]/mL — ABNORMAL HIGH (ref <5)

## 2012-01-02 MED ORDER — PROMETHAZINE HCL 25 MG PO TABS: 25.0000 mg | ORAL_TABLET | Freq: Four times a day (QID) | ORAL | Status: AC | PRN

## 2012-01-02 MED ORDER — TERCONAZOLE 0.4 % VA CREA: 1.0000 | TOPICAL_CREAM | Freq: Every day | VAGINAL | Status: AC

## 2012-01-02 NOTE — Discharge Instructions (Signed)
Return on Tuesday morning for repeat lab work.  Come between 8 am and 12 noon.  Abdominal Pain in Pregnancy  Abdominal discomfort is not unusual throughout a pregnancy and in most cases is a harmless occurance. However, if you have abdominal discomfort or pain that continues even after rest or gets worse, you should call your caregiver. There are several causes of abdominal pain that may or may not be related to the pregnancy. Certain serious conditions are significant to the pregnancy depending on the time of the pregnancy and should be evaluated for the safety of the mother and baby.   Early Pregnancy (first trimester). Lower abdominal pain and pelvic cramping is common in the early weeks of pregnancy. Usually it is not from any serious problem. The cause is often not found. It usually gets better in a few days without treatment. Miscarriage, ectopic pregnancy, or tubal pregnancy can also cause abdominal pain in the first trimester. Miscarriage is more likely if you also have vaginal bleeding or pass tissue. Ectopic pregnancy may be associated with vaginal bleeding. But the pain is usually in the lower right or left abdomen. Ectopic pregnancy pain is also more constant. It may also occur during a bowel movement. You should call your doctor immediately.  Until we know this is a pregnancy in the uterus and not in your tube:  Do not put anything in the vagina - no tampons, no douching. Do not have sexual intercourse. Do not lift more than 5 pounds. Return if you develop sudden, worse abdominal pain. Return if you have heavy vaginal bleeding.

## 2012-01-02 NOTE — MAU Note (Signed)
Pt states, " I have missed my period and I've had nausea for 3-4 days, cramping in my lower abdomen for one week. I had pink spotting yesterday but none today."

## 2012-01-02 NOTE — ED Notes (Signed)
No answer x2 

## 2012-01-02 NOTE — MAU Note (Signed)
Patient is here with c/o n/v, lower abodminal cramping and spotting since yesterday.

## 2012-01-02 NOTE — MAU Provider Note (Signed)
History     CSN: [id number]  Arrival date and time: 01/02/12 2440   First Provider Initiated Contact with Patient 01/02/12 2019      Chief Complaint  Patient presents with  . Abdominal Pain  . Nausea   HPI [name] 21 y.o. 6w 4d by LMP 11-17-11.  Delivered vaginally on 10-13-11.  Did not go to postpartum exam.  Having vaginal spotting yesterday and today.  Having lower abdominal cramping.  And is having nausea but no vomiting.  Has not started prenatal care.  OB History    Grav Para Term Preterm Abortions TAB SAB Ect Mult Living   2 1 1  0 0 0 0 0 0 1      Past Medical History  Diagnosis Date  . Mental disorder     depression  . Depression   . Depression     Past Surgical History  Procedure Date  . No past surgeries     Family History  Problem Relation Age of Onset  . Anesthesia problems Neg Hx   . Hypotension Neg Hx   . Malignant hyperthermia Neg Hx   . Pseudochol deficiency Neg Hx     History  Substance Use Topics  . Smoking status: Never Smoker   . Smokeless tobacco: Not on file  . Alcohol Use: No    Allergies: No Known Allergies  Prescriptions prior to admission  Medication Sig Dispense Refill  . escitalopram (LEXAPRO) 20 MG tablet Take 20 mg by mouth daily.        . Prenatal Vit-Fe Fumarate-FA (PRENATAL MULTIVITAMIN) TABS Take 1 tablet by mouth daily.  30 tablet  3  . erythromycin (ROMYCIN) ophthalmic ointment Apply 1/2  Ribbon in os tid after warm cloth soak to eye  3.5 g  0  . ipratropium (ATROVENT) 0.06 % nasal spray Place 2 sprays into the nose 4 (four) times daily.  15 mL  1    Review of Systems  Constitutional: Negative for fever.  Gastrointestinal: Positive for nausea and abdominal pain. Negative for vomiting.  Genitourinary: Negative for dysuria.       Vaginal spotting   Physical Exam   Blood pressure 115/50, pulse 79, temperature 99.7 F (37.6 C), temperature source Oral, resp. rate 18, height 5' 4.5" (1.638 m), weight 176 lb 6  oz (80.003 kg), last menstrual period 11/17/2011, not currently breastfeeding.  Physical Exam  Nursing note and vitals reviewed. Constitutional: She is oriented to person, place, and time. She appears well-developed and well-nourished.  HENT:  Head: Normocephalic.  Eyes: EOM are normal.  Neck: Neck supple.  GI: Soft. There is no tenderness.  Genitourinary:       Speculum exam: Vulva - negative Vagina - Erythema noted, Small amount of creamy discharge and a small amount of adherent discharge noted, no odor Cervix - No contact bleeding Bimanual exam: Cervix closed Uterus non tender, enlarged?, unable to size well due to habitus Adnexa non tender, no masses bilaterally GC/Chlam, wet prep done Chaperone present for exam.  Musculoskeletal: Normal range of motion.  Neurological: She is alert and oriented to person, place, and time.  Skin: Skin is warm and dry.  Psychiatric: She has a normal mood and affect.    MAU Course  Procedures Results for orders placed during the hospital encounter of 01/02/12 (from the past 24 hour(s))  URINALYSIS, ROUTINE W REFLEX MICROSCOPIC     Status: Abnormal   Collection Time   01/02/12  7:45 PM  Component Value Range   Color, Urine YELLOW  YELLOW    APPearance CLEAR  CLEAR    Specific Gravity, Urine 1.020  1.005 - 1.030    pH 6.0  5.0 - 8.0    Glucose, UA NEGATIVE  NEGATIVE (mg/dL)   Hgb urine dipstick NEGATIVE  NEGATIVE    Bilirubin Urine NEGATIVE  NEGATIVE    Ketones, ur NEGATIVE  NEGATIVE (mg/dL)   Protein, ur NEGATIVE  NEGATIVE (mg/dL)   Urobilinogen, UA 0.2  0.0 - 1.0 (mg/dL)   Nitrite NEGATIVE  NEGATIVE    Leukocytes, UA TRACE (*) NEGATIVE   URINE MICROSCOPIC-ADD ON     Status: Abnormal   Collection Time   01/02/12  7:45 PM      Component Value Range   Squamous Epithelial / LPF FEW (*) RARE    WBC, UA 3-6  <3 (WBC/hpf)   RBC / HPF 0-2  <3 (RBC/hpf)   Bacteria, UA MANY (*) RARE   POCT PREGNANCY, URINE     Status: Abnormal    Collection Time   01/02/12  7:53 PM      Component Value Range   Preg Test, Ur POSITIVE (*) NEGATIVE   CBC     Status: Abnormal   Collection Time   01/02/12  8:24 PM      Component Value Range   WBC 5.6  4.0 - 10.5 (K/uL)   RBC 4.00  3.87 - 5.11 (MIL/uL)   Hemoglobin 11.6 (*) 12.0 - 15.0 (g/dL)   HCT 45.4  09.8 - 11.9 (%)   MCV 90.3  78.0 - 100.0 (fL)   MCH 29.0  26.0 - 34.0 (pg)   MCHC 32.1  30.0 - 36.0 (g/dL)   RDW 14.7  82.9 - 56.2 (%)   Platelets 243  150 - 400 (K/uL)  HCG, QUANTITATIVE, PREGNANCY     Status: Abnormal   Collection Time   01/02/12  8:24 PM      Component Value Range   hCG, Beta Chain, Quant, S 1750 (*) <5 (mIU/mL)  WET PREP, GENITAL     Status: Abnormal   Collection Time   01/02/12  8:30 PM      Component Value Range   Yeast Wet Prep HPF POC FEW (*) NONE SEEN    Trich, Wet Prep NONE SEEN  NONE SEEN    Clue Cells Wet Prep HPF POC NONE SEEN  NONE SEEN    WBC, Wet Prep HPF POC MANY (*) NONE SEEN    Ultrasound:  Early IUGS, no yolk sac, no fetal pole  MDM Consult with Dr. Penne Lash re: plan of care  Assessment and Plan  Early pregnancy, no yolk sac on ultrasound, small subchorionic hemorrhage Minimal interpregnancy interval - delivered 10-13-11 Yeast infection  Plan Rx Terazol vaginal cream one applicator in vagina  Rx phenergan 25 mg one po q 4 hours prn Return to MAU on Tuesday morning between 8 am and 12 noon for repeat lab work No intercourse, no douching, no heavy lifting  [name] 01/02/2012, 8:25 PM

## 2012-01-02 NOTE — ED Notes (Signed)
Patient not in treatment room 

## 2012-01-03 NOTE — MAU Provider Note (Signed)
Medical Screening exam and patient care preformed by advanced practice provider.  Agree with the above management.  

## 2012-01-04 LAB — GC/CHLAMYDIA PROBE AMP, GENITAL: GC Probe Amp, Genital: NEGATIVE

## 2012-01-05 ENCOUNTER — Inpatient Hospital Stay (HOSPITAL_COMMUNITY)
Admission: AD | Admit: 2012-01-05 | Discharge: 2012-01-05 | Disposition: A | Discharge status: Home / Self Care | Payer: Medicaid Other | Source: Ambulatory Visit | Attending: Obstetrics & Gynecology | Admitting: Obstetrics & Gynecology

## 2012-01-05 DIAGNOSIS — Z09 Encounter for follow-up examination after completed treatment for conditions other than malignant neoplasm: Secondary | ICD-10-CM

## 2012-01-05 DIAGNOSIS — O99891 Other specified diseases and conditions complicating pregnancy: Secondary | ICD-10-CM | POA: Insufficient documentation

## 2012-01-05 NOTE — MAU Provider Note (Signed)
History     CSN: [id number]  Arrival date and time: 01/05/12 7829   First Provider Initiated Contact with Patient 01/05/12 [phone]      Chief Complaint  Patient presents with  . Labs Only   HPI [name] is 21 y.o. G2P1001 [redacted]w[redacted]d weeks presenting for repeat BHCG>  She denies bleeding or pain today.  She was seen on 3/23 with BHCG 1750.  U/S showed early IUGS without YS or FP.  She plans prenatal care at [facility].  She states she needs to leave because her boyfriend has to go to work.  She can return if needed by getting a ride by her boyfriend's Mother.      Past Medical History  Diagnosis Date  . Mental disorder     depression  . Depression   . Depression     Past Surgical History  Procedure Date  . No past surgeries     Family History  Problem Relation Age of Onset  . Anesthesia problems Neg Hx   . Hypotension Neg Hx   . Malignant hyperthermia Neg Hx   . Pseudochol deficiency Neg Hx     History  Substance Use Topics  . Smoking status: Never Smoker   . Smokeless tobacco: Not on file  . Alcohol Use: No    Allergies: No Known Allergies  Prescriptions prior to admission  Medication Sig Dispense Refill  . erythromycin [facility]) ophthalmic ointment Apply 1/2  Ribbon in os tid after warm cloth soak to eye  3.5 g  0  . escitalopram (LEXAPRO) 20 MG tablet Take 20 mg by mouth daily.        Marland Kitchen ipratropium (ATROVENT) 0.06 % nasal spray Place 2 sprays into the nose 4 (four) times daily.  15 mL  1  . Prenatal Vit-Fe Fumarate-FA (PRENATAL MULTIVITAMIN) TABS Take 1 tablet by mouth daily.  30 tablet  3  . promethazine (PHENERGAN) 25 MG tablet Take 1 tablet (25 mg total) by mouth every 6 (six) hours as needed for nausea. Use 1/2 tablet for milder symptoms.  Sedation precautions.  30 tablet  0  . terconazole (TERAZOL 7) 0.4 % vaginal cream Place 1 applicator vaginally at bedtime. Use for 7 days.  45 g  0    Review of Systems  Constitutional: Negative.     Gastrointestinal: Negative for abdominal pain.  Genitourinary:       Negative for vaginal bleeding   Physical Exam   Blood pressure 115/65, pulse 97, temperature 97.7 F (36.5 C), temperature source Oral, resp. rate 16, height 5\' 4"  (1.626 m), weight 80.854 kg (178 lb 4 oz), last menstrual period 11/17/2011, SpO2 100.00%, not currently breastfeeding.  Physical Exam  Constitutional: She is oriented to person, place, and time. She appears well-developed and well-nourished. No distress.  HENT:  Head: Normocephalic.  Neck: Normal range of motion.  Cardiovascular: Normal rate.   Respiratory: Effort normal.  Genitourinary:       Not indicated  Neurological: She is alert and oriented to person, place, and time.  Skin: Skin is warm and dry.  Psychiatric: She has a normal mood and affect. Her behavior is normal. Judgment and thought content normal.   Results for orders placed during the hospital encounter of 01/05/12 (from the past 24 hour(s))  HCG, QUANTITATIVE, PREGNANCY     Status: Abnormal   Collection Time   01/05/12  9:31 AM      Component Value Range   hCG, Beta Chain,  Quant, S 4886 (*) <5 (mIU/mL)   MAU Course  Procedures  MDM   Assessment and Plan  A;  Reassuring BHCGs in early pregnancy  P:  Patient was called and given BHCG results.  Instructed to return to MAU in 10 days for repeat ultrasound.  She was also instructed to return before that date if she has increased pain or bleeding.  She agreed to return.    Dalani Mette,EVE M 01/05/2012, 9:46 AM

## 2012-01-05 NOTE — MAU Note (Signed)
Pt here for repeat bhcg, denies bleeding or cramping. Notes stomach cramping, has had nausea since beginning of pregnancy.

## 2012-01-05 NOTE — MAU Note (Signed)
Pt okayed to be called with results per [facility], NP.

## 2012-01-05 NOTE — Discharge Instructions (Signed)
Abdominal Pain During Pregnancy °Belly (abdominal) pain is common during pregnancy. Most of the time, it is not a serious problem. Other times, it can be a sign that something is wrong with the pregnancy. Always tell your doctor if you have belly pain. °HOME CARE °For mild pain: °· Do not have sex (intercourse) or put anything in your vagina until you feel better.  °· Rest until your pain stops. If your pain lasts longer than 1 hour, call your doctor.  °· Drink clear fluids if you feel sick to your stomach (nauseous).  °· Do not eat solid food until you feel better.  °· Only take medicine as told by your doctor.  °· Keep all doctor visits as told.  °GET HELP RIGHT AWAY IF:  °· You are bleeding, leaking fluid, or pieces of tissue come out of your vagina.  °· You have more pain or cramping.  °· You keep throwing up (vomiting).  °· You have pain when you pee (urinate) or have blood in your pee.  °· You have a fever.  °· You do not feel your baby moving as much.  °· You feel very weak or feel like passing out.  °· You have trouble breathing, with or without belly pain.  °· You have a very bad headache and belly pain.  °· You have fluid leaking from your vagina and belly pain.  °· You keep having watery poop (diarrhea).  °· Your belly pain does not go away after resting, or the pain gets worse.  °MAKE SURE YOU:  °· Understand these instructions.  °· Will watch your condition.  °· Will get help right away if you are not doing well or get worse.  °Document Released: 09/16/2009 Document Revised: 09/17/2011 Document Reviewed: 04/24/2011 °[employer]® Patient Information ©2012 [employer]. °

## 2012-01-14 ENCOUNTER — Inpatient Hospital Stay (HOSPITAL_COMMUNITY): Payer: Medicaid Other

## 2012-01-14 ENCOUNTER — Inpatient Hospital Stay (HOSPITAL_COMMUNITY)
Admission: AD | Admit: 2012-01-14 | Discharge: 2012-01-14 | Disposition: A | Discharge status: Home / Self Care | Payer: Medicaid Other | Source: Ambulatory Visit | Attending: Obstetrics & Gynecology | Admitting: Obstetrics & Gynecology

## 2012-01-14 DIAGNOSIS — O210 Mild hyperemesis gravidarum: Secondary | ICD-10-CM | POA: Insufficient documentation

## 2012-01-14 DIAGNOSIS — Z09 Encounter for follow-up examination after completed treatment for conditions other than malignant neoplasm: Secondary | ICD-10-CM

## 2012-01-14 DIAGNOSIS — O99891 Other specified diseases and conditions complicating pregnancy: Secondary | ICD-10-CM | POA: Insufficient documentation

## 2012-01-14 MED ORDER — ONDANSETRON HCL 4 MG PO TABS: 4.0000 mg | ORAL_TABLET | Freq: Four times a day (QID) | ORAL | Status: AC

## 2012-01-14 NOTE — Discharge Instructions (Signed)
Return here as needed. °

## 2012-01-14 NOTE — MAU Note (Signed)
Patient to MAU for repeat BHCG. Denies any pain at this time, no bleeding. Has been having nausea with some vomiting for several days.

## 2012-01-14 NOTE — MAU Provider Note (Signed)
[name] is a 21 y.o. female @ [redacted]w[redacted]d gestation who presents to MAU for follow up ultrasound for viability. She denies pain or bleeding today.  VS reviewed and are normal.  Ultrasound today shows a 6.5 weeks IUP with cardiac activity at 124. EDD is 09/03/12.  Pregnancy verification letter given to patient. She will start prenatal care and return here as needed for problems.

## 2012-08-03 LAB — OB RESULTS CONSOLE HIV ANTIBODY (ROUTINE TESTING): HIV: NONREACTIVE

## 2012-08-05 LAB — OB RESULTS CONSOLE GC/CHLAMYDIA: Gonorrhea: NEGATIVE

## 2012-08-05 LAB — OB RESULTS CONSOLE GBS: GBS: NEGATIVE

## 2012-08-23 ENCOUNTER — Encounter (HOSPITAL_COMMUNITY): Payer: Self-pay | Admitting: *Deleted

## 2012-08-23 ENCOUNTER — Inpatient Hospital Stay (HOSPITAL_COMMUNITY)
Admission: AD | Admit: 2012-08-23 | Discharge: 2012-08-23 | Disposition: A | Discharge status: Home / Self Care | Payer: Medicaid Other | Source: Ambulatory Visit | Attending: Obstetrics | Admitting: Obstetrics

## 2012-08-23 DIAGNOSIS — K5289 Other specified noninfective gastroenteritis and colitis: Secondary | ICD-10-CM

## 2012-08-23 DIAGNOSIS — O368190 Decreased fetal movements, unspecified trimester, not applicable or unspecified: Secondary | ICD-10-CM | POA: Insufficient documentation

## 2012-08-23 DIAGNOSIS — R197 Diarrhea, unspecified: Secondary | ICD-10-CM | POA: Insufficient documentation

## 2012-08-23 DIAGNOSIS — O99891 Other specified diseases and conditions complicating pregnancy: Secondary | ICD-10-CM | POA: Insufficient documentation

## 2012-08-23 DIAGNOSIS — O212 Late vomiting of pregnancy: Secondary | ICD-10-CM | POA: Insufficient documentation

## 2012-08-23 DIAGNOSIS — K529 Noninfective gastroenteritis and colitis, unspecified: Secondary | ICD-10-CM

## 2012-08-23 LAB — URINALYSIS, ROUTINE W REFLEX MICROSCOPIC
Bilirubin Urine: NEGATIVE
Glucose, UA: NEGATIVE mg/dL
Hgb urine dipstick: NEGATIVE
Specific Gravity, Urine: 1.02 (ref 1.005–1.030)

## 2012-08-23 LAB — URINE MICROSCOPIC-ADD ON

## 2012-08-23 MED ORDER — ONDANSETRON HCL 4 MG/2ML IJ SOLN
4.0000 mg | Freq: Once | INTRAMUSCULAR | Status: AC
  Administered 2012-08-23: 4 mg via INTRAVENOUS
  Filled 2012-08-23: qty 2

## 2012-08-23 MED ORDER — ONDANSETRON 8 MG PO TBDP: 8.0000 mg | ORAL_TABLET | Freq: Three times a day (TID) | ORAL | Status: DC | PRN

## 2012-08-23 MED ORDER — LACTATED RINGERS IV BOLUS (SEPSIS)
1000.0000 mL | Freq: Once | INTRAVENOUS | Status: AC
  Administered 2012-08-23: 1000 mL via INTRAVENOUS

## 2012-08-23 MED ORDER — DIPHENOXYLATE-ATROPINE 2.5-0.025 MG PO TABS: 1.0000 | ORAL_TABLET | Freq: Four times a day (QID) | ORAL | Status: DC | PRN

## 2012-08-23 NOTE — MAU Provider Note (Signed)
History     CSN: [id number]  Arrival date and time: 08/23/12 1319   First Provider Initiated Contact with Patient 08/23/12 1431      Chief Complaint  Patient presents with  . Nausea  . Diarrhea  . Emesis   HPI 21 y.o. G2P1001 at [redacted]w[redacted]d with diarrhea x 2 days, n/v since last night. Not able to keep much down. Somewhat decreased fetal movement. "Strong braxton hicks". No bleeding or LoF.    Past Medical History  Diagnosis Date  . Mental disorder     depression  . Depression   . Depression     Past Surgical History  Procedure Date  . No past surgeries     Family History  Problem Relation Age of Onset  . Anesthesia problems Neg Hx   . Hypotension Neg Hx   . Malignant hyperthermia Neg Hx   . Pseudochol deficiency Neg Hx     History  Substance Use Topics  . Smoking status: Never Smoker   . Smokeless tobacco: Not on file  . Alcohol Use: No    Allergies: No Known Allergies  Prescriptions prior to admission  Medication Sig Dispense Refill  . Prenatal Vit-Fe Fumarate-FA (PRENATAL MULTIVITAMIN) TABS Take 1 tablet by mouth daily.  30 tablet  3    Review of Systems  Constitutional: Negative.   Respiratory: Negative.   Cardiovascular: Negative.   Gastrointestinal: Positive for nausea, vomiting and diarrhea. Negative for abdominal pain and constipation.  Genitourinary: Negative for dysuria, urgency, frequency, hematuria and flank pain.       Negative for vaginal bleeding, + "braxton hicks"  Musculoskeletal: Negative.   Neurological: Negative.   Psychiatric/Behavioral: Negative.    Physical Exam   Blood pressure 126/80, pulse 91, temperature 98 F (36.7 C), temperature source Oral, resp. rate 18, height 5' 5.5" (1.664 m), weight 188 lb 8 oz (85.503 kg), last menstrual period 11/17/2011, unknown if currently breastfeeding.  Physical Exam  Nursing note and vitals reviewed. Constitutional: She is oriented to person, place, and time. She appears well-developed and  well-nourished. No distress.  Cardiovascular: Normal rate.   Respiratory: Effort normal.  GI: Soft. There is no tenderness.  Musculoskeletal: Normal range of motion.  Neurological: She is alert and oriented to person, place, and time.  Skin: Skin is warm and dry.  Psychiatric: She has a normal mood and affect.    MAU Course  Procedures  Results for orders placed during the hospital encounter of 08/23/12 (from the past 24 hour(s))  URINALYSIS, ROUTINE W REFLEX MICROSCOPIC     Status: Abnormal   Collection Time   08/23/12  1:52 PM      Component Value Range   Color, Urine YELLOW  YELLOW   APPearance HAZY (*) CLEAR   Specific Gravity, Urine 1.020  1.005 - 1.030   pH 7.5  5.0 - 8.0   Glucose, UA NEGATIVE  NEGATIVE mg/dL   Hgb urine dipstick NEGATIVE  NEGATIVE   Bilirubin Urine NEGATIVE  NEGATIVE   Ketones, ur 15 (*) NEGATIVE mg/dL   Protein, ur NEGATIVE  NEGATIVE mg/dL   Urobilinogen, UA 0.2  0.0 - 1.0 mg/dL   Nitrite NEGATIVE  NEGATIVE   Leukocytes, UA TRACE (*) NEGATIVE  URINE MICROSCOPIC-ADD ON     Status: Abnormal   Collection Time   08/23/12  1:52 PM      Component Value Range   Squamous Epithelial / LPF MANY (*) RARE   WBC, UA 0-2  <3 WBC/hpf   RBC /  HPF 0-2  <3 RBC/hpf   Bacteria, UA FEW (*) RARE      . [COMPLETED] lactated ringers  1,000 mL Intravenous Once  . [COMPLETED] ondansetron (ZOFRAN) IV  4 mg Intravenous Once   Feeling much better after IV hydration and zofran, able to tolerate PO crackers and sprite  Assessment and Plan   1. Gastroenteritis       Medication List     As of 08/23/2012  4:49 PM    START taking these medications         diphenoxylate-atropine 2.5-0.025 MG per tablet   Commonly known as: LOMOTIL   Take 1 tablet by mouth 4 (four) times daily as needed for diarrhea or loose stools.      ondansetron 8 MG disintegrating tablet   Commonly known as: ZOFRAN-ODT   Take 1 tablet (8 mg total) by mouth every 8 (eight) hours as needed for  nausea.      CONTINUE taking these medications         prenatal multivitamin Tabs   Take 1 tablet by mouth daily.          Where to get your medications    These are the prescriptions that you need to pick up. We sent them to a specific pharmacy, so you will need to go there to get them.   [id number] - [place], [place] - [address] AT [facility] OF [address]    [address] [place] [address]    Phone: [phone]    Hours: 24-hours        ondansetron 8 MG disintegrating tablet         You may get these medications from any pharmacy.         diphenoxylate-atropine 2.5-0.025 MG per tablet            Follow-up Information    Follow up with Roseanna Rainbow, MD. (as scheduled or sooner as needed)    Contact information:   [address] [place] [address] [phone]            Georges Mouse 08/23/2012, 2:31 PM

## 2012-08-23 NOTE — MAU Note (Signed)
Pt states started having n/v/d last pm, no fever or chills, denies sore throat. Denies abnormal vag d/c changes or bleeding.

## 2012-09-05 ENCOUNTER — Encounter (HOSPITAL_COMMUNITY): Payer: Self-pay | Admitting: Anesthesiology

## 2012-09-05 ENCOUNTER — Inpatient Hospital Stay (HOSPITAL_COMMUNITY): Payer: Medicaid Other | Admitting: Anesthesiology

## 2012-09-05 ENCOUNTER — Inpatient Hospital Stay (HOSPITAL_COMMUNITY)
Admission: AD | Admit: 2012-09-05 | Discharge: 2012-09-07 | DRG: 775 | Disposition: A | Discharge status: Home / Self Care | Payer: Medicaid Other | Source: Ambulatory Visit | Attending: Obstetrics & Gynecology | Admitting: Obstetrics & Gynecology

## 2012-09-05 ENCOUNTER — Encounter (HOSPITAL_COMMUNITY): Payer: Self-pay | Admitting: *Deleted

## 2012-09-05 DIAGNOSIS — D573 Sickle-cell trait: Secondary | ICD-10-CM | POA: Diagnosis present

## 2012-09-05 DIAGNOSIS — O9902 Anemia complicating childbirth: Secondary | ICD-10-CM | POA: Diagnosis present

## 2012-09-05 DIAGNOSIS — IMO0001 Reserved for inherently not codable concepts without codable children: Secondary | ICD-10-CM

## 2012-09-05 HISTORY — DX: Sickle-cell trait: D57.3

## 2012-09-05 LAB — CBC
HCT: 36.9 % (ref 36.0–46.0)
Hemoglobin: 12.2 g/dL (ref 12.0–15.0)
RDW: 13.8 % (ref 11.5–15.5)
WBC: 9.7 10*3/uL (ref 4.0–10.5)

## 2012-09-05 MED ORDER — LACTATED RINGERS IV SOLN
500.0000 mL | INTRAVENOUS | Status: DC | PRN
  Administered 2012-09-05: 300 mL via INTRAVENOUS

## 2012-09-05 MED ORDER — CITRIC ACID-SODIUM CITRATE 334-500 MG/5ML PO SOLN: 30.0000 mL | ORAL | Status: DC | PRN

## 2012-09-05 MED ORDER — TERBUTALINE SULFATE 1 MG/ML IJ SOLN: 0.2500 mg | Freq: Once | INTRAMUSCULAR | Status: AC | PRN

## 2012-09-05 MED ORDER — ONDANSETRON HCL 4 MG/2ML IJ SOLN: 4.0000 mg | Freq: Four times a day (QID) | INTRAMUSCULAR | Status: DC | PRN

## 2012-09-05 MED ORDER — ACETAMINOPHEN 325 MG PO TABS: 650.0000 mg | ORAL_TABLET | ORAL | Status: DC | PRN

## 2012-09-05 MED ORDER — EPHEDRINE 5 MG/ML INJ: 10.0000 mg | INTRAVENOUS | Status: DC | PRN

## 2012-09-05 MED ORDER — PHENYLEPHRINE 40 MCG/ML (10ML) SYRINGE FOR IV PUSH (FOR BLOOD PRESSURE SUPPORT)
80.0000 ug | PREFILLED_SYRINGE | INTRAVENOUS | Status: DC | PRN
  Filled 2012-09-05: qty 5

## 2012-09-05 MED ORDER — DIPHENHYDRAMINE HCL 50 MG/ML IJ SOLN
12.5000 mg | INTRAMUSCULAR | Status: DC | PRN
Start: 2012-09-05 — End: 2012-09-06

## 2012-09-05 MED ORDER — LACTATED RINGERS IV SOLN
INTRAVENOUS | Status: DC
  Administered 2012-09-06: via INTRAVENOUS

## 2012-09-05 MED ORDER — OXYTOCIN BOLUS FROM INFUSION: 500.0000 mL | INTRAVENOUS | Status: DC

## 2012-09-05 MED ORDER — OXYTOCIN 40 UNITS IN LACTATED RINGERS INFUSION - SIMPLE MED
62.5000 mL/h | INTRAVENOUS | Status: DC
  Administered 2012-09-06: 500 mL/h via INTRAVENOUS
  Administered 2012-09-06: 62.5 mL/h via INTRAVENOUS
  Filled 2012-09-05: qty 1000

## 2012-09-05 MED ORDER — EPHEDRINE 5 MG/ML INJ
10.0000 mg | INTRAVENOUS | Status: DC | PRN
  Filled 2012-09-05: qty 4

## 2012-09-05 MED ORDER — FLEET ENEMA 7-19 GM/118ML RE ENEM: 1.0000 | ENEMA | RECTAL | Status: DC | PRN

## 2012-09-05 MED ORDER — IBUPROFEN 600 MG PO TABS
600.0000 mg | ORAL_TABLET | Freq: Four times a day (QID) | ORAL | Status: DC | PRN
  Administered 2012-09-06: 600 mg via ORAL
  Filled 2012-09-05: qty 1

## 2012-09-05 MED ORDER — FENTANYL 2.5 MCG/ML BUPIVACAINE 1/10 % EPIDURAL INFUSION (WH - ANES)
14.0000 mL/h | INTRAMUSCULAR | Status: DC
  Administered 2012-09-06: 14 mL/h via EPIDURAL
  Filled 2012-09-05: qty 125

## 2012-09-05 MED ORDER — LACTATED RINGERS IV SOLN
500.0000 mL | Freq: Once | INTRAVENOUS | Status: AC
  Administered 2012-09-06: 01:00:00 via INTRAVENOUS

## 2012-09-05 MED ORDER — OXYCODONE-ACETAMINOPHEN 5-325 MG PO TABS
1.0000 | ORAL_TABLET | ORAL | Status: DC | PRN
Start: 2012-09-05 — End: 2012-09-06

## 2012-09-05 MED ORDER — OXYTOCIN 40 UNITS IN LACTATED RINGERS INFUSION - SIMPLE MED
1.0000 m[IU]/min | INTRAVENOUS | Status: DC
  Administered 2012-09-06: 2 m[IU]/min via INTRAVENOUS

## 2012-09-05 MED ORDER — PHENYLEPHRINE 40 MCG/ML (10ML) SYRINGE FOR IV PUSH (FOR BLOOD PRESSURE SUPPORT): 80.0000 ug | PREFILLED_SYRINGE | INTRAVENOUS | Status: DC | PRN

## 2012-09-05 MED ORDER — LIDOCAINE HCL (PF) 1 % IJ SOLN
30.0000 mL | INTRAMUSCULAR | Status: DC | PRN
  Filled 2012-09-05 (×2): qty 30

## 2012-09-05 NOTE — Anesthesia Preprocedure Evaluation (Signed)
Anesthesia Evaluation  Patient identified by MRN, date of birth, ID band Patient awake    Reviewed: Allergy & Precautions, H&P , Patient's Chart, lab work & pertinent test results  Airway Mallampati: II TM Distance: >3 FB Neck ROM: full    Dental No notable dental hx.    Pulmonary neg pulmonary ROS,  breath sounds clear to auscultation  Pulmonary exam normal       Cardiovascular negative cardio ROS  Rhythm:regular Rate:Normal     Neuro/Psych negative neurological ROS  negative psych ROS   GI/Hepatic negative GI ROS, Neg liver ROS,   Endo/Other  negative endocrine ROS  Renal/GU negative Renal ROS     Musculoskeletal   Abdominal   Peds  Hematology negative hematology ROS (+)   Anesthesia Other Findings Mental disorder   depression Depression        Depression     Sickle cell trait    Reproductive/Obstetrics (+) Pregnancy                           Anesthesia Physical Anesthesia Plan  ASA: II  Anesthesia Plan: Epidural   Post-op Pain Management:    Induction:   Airway Management Planned:   Additional Equipment:   Intra-op Plan:   Post-operative Plan:   Informed Consent: I have reviewed the patients History and Physical, chart, labs and discussed the procedure including the risks, benefits and alternatives for the proposed anesthesia with the patient or authorized representative who has indicated his/her understanding and acceptance.     Plan Discussed with:   Anesthesia Plan Comments:         Anesthesia Quick Evaluation

## 2012-09-05 NOTE — H&P (Signed)
[name] is Little 21 y.o. female presenting for contractions. Maternal Medical History:  Reason for admission: Reason for admission: contractions.  Contractions: Onset was 6-12 hours ago.   Frequency: regular.   Perceived severity is strong.    Fetal activity: Perceived fetal activity is normal.    Prenatal complications: Substance abuse: cannabis abuse.     OB History    Grav Para Term Preterm Abortions TAB SAB Ect Mult Living   3 1 1  0 0 0 0 0 0 1     Past Medical History  Diagnosis Date  . Mental disorder     depression  . Depression   . Depression   . Sickle cell trait    Past Surgical History  Procedure Date  . No past surgeries    Family History: family history is negative for Anesthesia problems, and Hypotension, and Malignant hyperthermia, and Pseudochol deficiency, . Social History:  reports that she has never smoked. She does not have any smokeless tobacco history on file. She reports that she does not drink alcohol or use illicit drugs.     Review of Systems  Constitutional: Negative for fever.  Eyes: Negative for blurred vision.  Respiratory: Negative for shortness of breath.   Gastrointestinal: Negative for vomiting.  Skin: Negative for rash.  Neurological: Negative for headaches.    Dilation: 3 Effacement (%): 80 Station: -3 Exam by:: Jeanette German RN Blood pressure 131/82, pulse 76, temperature 98.2 F (36.8 C), temperature source Oral, resp. rate 20, height 5' 5.5" (1.664 m), weight 85.639 kg (188 lb 12.8 oz), last menstrual period 11/17/2011, unknown if currently breastfeeding. Maternal Exam:  Abdomen: not evaluated.  Introitus: not evaluated.   Cervix: Cervix evaluated by digital exam.     Fetal Exam Fetal Monitor Review: Variability: moderate (6-25 bpm).   Pattern: accelerations present and no decelerations.    Fetal State Assessment: Category I - tracings are normal.     Physical Exam  Constitutional: She appears well-developed.    HENT:  Head: Normocephalic.  Neck: Neck supple. No thyromegaly present.  Cardiovascular: Normal rate and regular rhythm.   Respiratory: Breath sounds normal.  GI: Soft. Bowel sounds are normal.  Skin: No rash noted.    Prenatal labs: ABO, Rh:   Antibody:   Rubella:   RPR: NON REACTIVE (12/31 2135)  HBsAg:    HIV:    GBS:  negative  Assessment/Plan: Primipara @ [redacted]w[redacted]d.  Early labor.  Category I FHT.  Admit UDS Monitor progress   [name] 09/05/2012, 7:28 PM

## 2012-09-05 NOTE — MAU Note (Signed)
uc's since 0330, 5-8 minutes apart, lost mucus plug yesterday, denies bleeding or LOF.  SVE 2 cm's in MAU on 11/12.

## 2012-09-06 ENCOUNTER — Encounter (HOSPITAL_COMMUNITY): Payer: Self-pay | Admitting: *Deleted

## 2012-09-06 MED ORDER — ONDANSETRON HCL 4 MG PO TABS: 4.0000 mg | ORAL_TABLET | ORAL | Status: DC | PRN

## 2012-09-06 MED ORDER — MAGNESIUM HYDROXIDE 400 MG/5ML PO SUSP: 30.0000 mL | ORAL | Status: DC | PRN

## 2012-09-06 MED ORDER — TETANUS-DIPHTH-ACELL PERTUSSIS 5-2.5-18.5 LF-MCG/0.5 IM SUSP: 0.5000 mL | Freq: Once | INTRAMUSCULAR | Status: DC

## 2012-09-06 MED ORDER — LANOLIN HYDROUS EX OINT: TOPICAL_OINTMENT | CUTANEOUS | Status: DC | PRN

## 2012-09-06 MED ORDER — SENNOSIDES-DOCUSATE SODIUM 8.6-50 MG PO TABS
2.0000 | ORAL_TABLET | Freq: Every day | ORAL | Status: DC
  Administered 2012-09-06: 2 via ORAL

## 2012-09-06 MED ORDER — DIBUCAINE 1 % RE OINT: 1.0000 "application " | TOPICAL_OINTMENT | RECTAL | Status: DC | PRN

## 2012-09-06 MED ORDER — OXYCODONE-ACETAMINOPHEN 5-325 MG PO TABS: 1.0000 | ORAL_TABLET | ORAL | Status: DC | PRN

## 2012-09-06 MED ORDER — BENZOCAINE-MENTHOL 20-0.5 % EX AERO: 1.0000 "application " | INHALATION_SPRAY | CUTANEOUS | Status: DC | PRN

## 2012-09-06 MED ORDER — ONDANSETRON HCL 4 MG/2ML IJ SOLN: 4.0000 mg | INTRAMUSCULAR | Status: DC | PRN

## 2012-09-06 MED ORDER — LIDOCAINE HCL (PF) 1 % IJ SOLN
INTRAMUSCULAR | Status: DC | PRN
  Administered 2012-09-06 (×2): 5 mL

## 2012-09-06 MED ORDER — PRENATAL MULTIVITAMIN CH
1.0000 | ORAL_TABLET | Freq: Every day | ORAL | Status: DC
  Administered 2012-09-06 – 2012-09-07 (×2): 1 via ORAL
  Filled 2012-09-06 (×2): qty 1

## 2012-09-06 MED ORDER — FERROUS SULFATE 325 (65 FE) MG PO TABS
325.0000 mg | ORAL_TABLET | Freq: Two times a day (BID) | ORAL | Status: DC
  Administered 2012-09-06 – 2012-09-07 (×3): 325 mg via ORAL
  Filled 2012-09-06 (×3): qty 1

## 2012-09-06 MED ORDER — ZOLPIDEM TARTRATE 5 MG PO TABS: 5.0000 mg | ORAL_TABLET | Freq: Every evening | ORAL | Status: DC | PRN

## 2012-09-06 MED ORDER — WITCH HAZEL-GLYCERIN EX PADS: 1.0000 "application " | MEDICATED_PAD | CUTANEOUS | Status: DC | PRN

## 2012-09-06 MED ORDER — MEASLES, MUMPS & RUBELLA VAC ~~LOC~~ INJ
0.5000 mL | INJECTION | Freq: Once | SUBCUTANEOUS | Status: DC
  Filled 2012-09-06: qty 0.5

## 2012-09-06 MED ORDER — IBUPROFEN 600 MG PO TABS
600.0000 mg | ORAL_TABLET | Freq: Four times a day (QID) | ORAL | Status: DC
  Administered 2012-09-06 – 2012-09-07 (×5): 600 mg via ORAL
  Filled 2012-09-06 (×5): qty 1

## 2012-09-06 MED ORDER — DIPHENHYDRAMINE HCL 25 MG PO CAPS: 25.0000 mg | ORAL_CAPSULE | Freq: Four times a day (QID) | ORAL | Status: DC | PRN

## 2012-09-06 NOTE — Progress Notes (Signed)
Pt ambulates to br, voids, pericare done.  Ambulates to wc

## 2012-09-06 NOTE — Anesthesia Postprocedure Evaluation (Signed)
  Anesthesia Post-op Note  Patient: [name]  Procedure(s) Performed: * No procedures listed *  Patient Location: Mother/Baby  Anesthesia Type:Epidural  Level of Consciousness: awake, alert  and oriented  Airway and Oxygen Therapy: Patient Spontanous Breathing  Post-op Pain: none  Post-op Assessment: Post-op Vital signs reviewed, Patient's Cardiovascular Status Stable, No headache, No backache, No residual numbness and No residual motor weakness  Post-op Vital Signs: Reviewed and stable  Complications: No apparent anesthesia complications

## 2012-09-06 NOTE — Progress Notes (Signed)
UR done. 

## 2012-09-06 NOTE — Anesthesia Procedure Notes (Signed)
Epidural Patient location during procedure: OB Start time: 09/06/2012 2:07 AM  Staffing Anesthesiologist: Brayton Caves R Performed by: anesthesiologist   Preanesthetic Checklist Completed: patient identified, site marked, surgical consent, pre-op evaluation, timeout performed, IV checked, risks and benefits discussed and monitors and equipment checked  Epidural Patient position: sitting Prep: site prepped and draped and DuraPrep Patient monitoring: continuous pulse ox and blood pressure Approach: midline Injection technique: LOR air and LOR saline  Needle:  Needle type: Tuohy  Needle gauge: 17 G Needle length: 9 cm and 9 Needle insertion depth: 5 cm cm Catheter type: closed end flexible Catheter size: 19 Gauge Catheter at skin depth: 10 cm Test dose: negative  Assessment Events: blood not aspirated, injection not painful, no injection resistance, negative IV test and no paresthesia  Additional Notes Patient identified.  Risk benefits discussed including failed block, incomplete pain control, headache, nerve damage, paralysis, blood pressure changes, nausea, vomiting, reactions to medication both toxic or allergic, and postpartum back pain.  Patient expressed understanding and wished to proceed.  All questions were answered.  Sterile technique used throughout procedure and epidural site dressed with sterile barrier dressing. No paresthesia or other complications noted.The patient did not experience any signs of intravascular injection such as tinnitus or metallic taste in mouth nor signs of intrathecal spread such as rapid motor block. Please see nursing notes for vital signs.

## 2012-09-06 NOTE — Progress Notes (Signed)
Dr. Tamela Oddi notified of decels.

## 2012-09-07 MED ORDER — MEDROXYPROGESTERONE ACETATE 150 MG/ML IM SUSP: 150.0000 mg | INTRAMUSCULAR | Status: DC

## 2012-09-07 MED ORDER — OXYCODONE-ACETAMINOPHEN 5-325 MG PO TABS: 1.0000 | ORAL_TABLET | Freq: Four times a day (QID) | ORAL | Status: DC | PRN

## 2012-09-07 NOTE — Discharge Summary (Signed)
  Obstetric Discharge Summary Reason for Admission: onset of labor Prenatal Procedures: none Intrapartum Procedures: spontaneous vaginal delivery Postpartum Procedures: none Complications-Operative and Postpartum: none  Hemoglobin  Date Value Range Status  09/05/2012 12.2  12.0 - 15.0 g/dL Final     HCT  Date Value Range Status  09/05/2012 36.9  36.0 - 46.0 % Final    Physical Exam:  General: alert Lochia: appropriate Uterine: firm Incision: n/[name] DVT Evaluation: No evidence of DVT seen on physical exam.  Discharge Diagnoses: Active Problems:  Active labor  Normal delivery   Discharge Information: Date: 09/07/2012 Activity: pelvic rest Diet: routine Medications:  Prior to Admission medications   Medication Sig Start Date End Date Taking? Authorizing Provider  Prenatal Vit-Fe Fumarate-FA (PRENATAL MULTIVITAMIN) TABS Take 1 tablet by mouth daily. 10/15/11  Yes Jeanette Burrow, [name]  medroxyPROGESTERone (DEPO-PROVERA) 150 MG/ML injection Inject 1 mL (150 mg total) into the muscle every 3 (three) months. 09/07/12   Jeanette Char, [name]  oxyCODONE-acetaminophen (PERCOCET/ROXICET) 5-325 MG per tablet Take 1-2 tablets by mouth every 6 (six) hours as needed (moderate - severe pain). 09/07/12   Jeanette Char, [name]    Condition: stable Instructions: refer to routine discharge instructions Discharge to: home Follow-up Information    Follow up with [name]. Schedule an appointment as soon as possible for [name] visit in 2 weeks.   Contact information:   [address] [place] [address] [phone]          Newborn Data: Live born  Information for the patient's newborn:  [name], [name] [[id number]]  female ; APGAR (1 MIN): 9   APGAR (5 MINS): 9    Home with mother.  [name] 09/07/2012, 8:57 AM

## 2012-09-07 NOTE — Clinical Social Work Maternal (Signed)
Clinical Social Work Department PSYCHOSOCIAL ASSESSMENT - MATERNAL/CHILD 09/07/2012  Patient:  [name],[name]  Account Number:  [id number]  Admit Date:  09/05/2012  Childs Name:   [name]   Clinical Social Worker:  [name], LCSW   Date/Time:  09/07/2012 10:30 AM  Date Referred:  09/07/2012   Referral source CN    Referred reason Behavioral Health Issues Substance Abuse  Other referral source:    I:  FAMILY / HOME ENVIRONMENT Child's legal guardian:  PARENT  Guardian - Name Guardian - Age Guardian - Address [name] 21 [address] [place], [place] [address] [name]  same  Other household support members/support persons Name Relationship DOB [name] Jr. BROTHER 11 months  Other support:   [name] reports she has a great support system.   II  PSYCHOSOCIAL DATA Information Source:  Patient Interview  Financial and Community Resources Employment:   Financial resources:  Medicaid If Medicaid - County:  [place]  School / Grade:   Maternity Care Coordinator / Child Services Coordination / Early Interventions:  Cultural issues impacting care:   None identified   III  STRENGTHS Strengths Adequate Resources Compliance with medical plan Home prepared for Child (including basic supplies) Supportive family/friends  Strength comment:    IV  RISK FACTORS AND CURRENT PROBLEMS Current Problem:  YES   Risk Factor & Current Problem Patient Issue Family Issue Risk Factor / Current Problem Comment Mental Illness Y N [name]-Anx/Dep Substance Abuse Y N [name]-Hx of Marijuana use   V  SOCIAL WORK ASSESSMENT CSW met with [name] in her first floor room/143 to complete assessment.  [name] was very pleasant and talkative with CSW. She states she and baby (who was in the nursery for [facility]) are doing well.  She reports feelings of stress over having another baby who was not planned, but thinks it is a normal level of stress.  She was very open about her hx  of Anx/Dep and states it started around the time she got pregnant with her son.  She reports having a very good relationship with FOB at this time, but then they had not known each other long when they moved in together and then found out she was pregnant.  She reports her doctor prescribing Lexapro at that time, which she took for the majority of that pregnancy.  She went off of the medication and does not feel like she needs it at this time.  She thinks she started feeling better when her relationship with FOB improved.  She states although she is stressed and this baby was not planned, she has felt very happy since finding out she was pregnant with her daughter.  [name] admits to cutting herself when her depression was at its worst, but no cutting behavior recently.  She admits to smoking marijuana regularly with her first pregnancy and that her son was born positive.  She had a case with CPS at that time.  She states she and FOB smoked together and together decided to quit a few months ago.  She states she is not addicted and has no urge to smoke at this time.  CSW explained hospital drug screen policy and she was understanding.  She denies any other drug use.  CSW notes discrepency in [name] and baby's charts.  [name]'s PNR states THC use twice per week and baby's chart states twice per day. Either way, [name] denies use now.   CSW discussed signs and symptoms of PPD and gave "Feelings   After Birth" handout. She reports she feels comfortable talking with her doctor if symptoms of Anx/Dep return and that she has a counselor at [facility] whom she can call anytime.  CSW has no further questions or concerns.     VI SOCIAL WORK PLAN Social Work Plan No Further Intervention Required / No Barriers to Discharge  Type of pt/family education:   PPD  If child protective services report - county:   If child protective services report - date:   Information/referral to community resources comment:   [name] will return to  [facility] if needed.  Other social work plan:   CSW will monitor drug screens.    

## 2012-09-20 ENCOUNTER — Telehealth (HOSPITAL_COMMUNITY): Payer: Self-pay | Admitting: *Deleted

## 2012-09-20 NOTE — Telephone Encounter (Signed)
Resolve episode 

## 2012-12-27 ENCOUNTER — Ambulatory Visit: Payer: Self-pay | Admitting: Obstetrics

## 2013-02-07 ENCOUNTER — Encounter: Payer: Self-pay | Admitting: Obstetrics

## 2013-02-07 ENCOUNTER — Ambulatory Visit (INDEPENDENT_AMBULATORY_CARE_PROVIDER_SITE_OTHER): Payer: Medicaid Other | Admitting: Obstetrics

## 2013-02-07 VITALS — BP 116/74 | HR 72 | Temp 98.0°F | Ht 65.0 in | Wt 203.0 lb

## 2013-02-07 DIAGNOSIS — M545 Low back pain, unspecified: Secondary | ICD-10-CM | POA: Insufficient documentation

## 2013-02-07 DIAGNOSIS — Z3046 Encounter for surveillance of implantable subdermal contraceptive: Secondary | ICD-10-CM

## 2013-02-07 MED ORDER — IBUPROFEN 800 MG PO TABS: 800.0000 mg | ORAL_TABLET | Freq: Three times a day (TID) | ORAL | Status: DC | PRN

## 2013-02-07 MED ORDER — CYCLOBENZAPRINE HCL 10 MG PO TABS: 10.0000 mg | ORAL_TABLET | Freq: Three times a day (TID) | ORAL | Status: DC | PRN

## 2013-02-07 NOTE — Progress Notes (Signed)
Subjective:     [name] is a 22 y.o. female here for Nexplanon follow up.  Current complaints none.  Personal health questionnaire reviewed: yes.   Gynecologic History Patient's last menstrual period was 01/13/2013. Contraception: Nexplanon , condoms.   The following portions of the patient's history were reviewed and updated as appropriate: allergies, current medications, past family history, past medical history, past social history, past surgical history and problem list.  Review of Systems Pertinent items are noted in HPI.    Objective:    General appearance: alert and no distress    PE:  Right arm Nexplanon insertion site clean, nontender.  Rod palpated, intact  Assessment:    Healthy female exam.  Doing well on Nexplanon.  Low Backache radiating down legs.   Plan:    Education reviewed: safe sex/STD prevention.    Physical Therapy referral.  Flexeril and Ibuprofen Rx.

## 2013-02-07 NOTE — Patient Instructions (Addendum)
Backache Nexplanon

## 2013-03-07 ENCOUNTER — Ambulatory Visit (INDEPENDENT_AMBULATORY_CARE_PROVIDER_SITE_OTHER): Payer: Medicaid Other | Admitting: Obstetrics

## 2013-03-07 ENCOUNTER — Encounter: Payer: Self-pay | Admitting: Obstetrics

## 2013-03-07 VITALS — BP 123/81 | HR 82 | Temp 97.6°F | Ht 65.5 in | Wt 199.5 lb

## 2013-03-07 DIAGNOSIS — R52 Pain, unspecified: Secondary | ICD-10-CM

## 2013-03-07 DIAGNOSIS — M549 Dorsalgia, unspecified: Secondary | ICD-10-CM

## 2013-03-07 NOTE — Patient Instructions (Signed)
Knee Pain  The knee is the complex joint between your thigh and your lower leg. It is made up of bones, tendons, ligaments, and cartilage. The bones that make up the knee are:   The femur in the thigh.   The tibia and fibula in the lower leg.   The patella or kneecap riding in the groove on the lower femur.  CAUSES   Knee pain is a common complaint with many causes. A few of these causes are:   Injury, such as:   A ruptured ligament or tendon injury.   Torn cartilage.   Medical conditions, such as:   Gout   Arthritis   Infections   Overuse, over training or overdoing a physical activity.  Knee pain can be minor or severe. Knee pain can accompany debilitating injury. Minor knee problems often respond well to self-care measures or get well on their own. More serious injuries may need medical intervention or even surgery.  SYMPTOMS  The knee is complex. Symptoms of knee problems can vary widely. Some of the problems are:   Pain with movement and weight bearing.   Swelling and tenderness.   Buckling of the knee.   Inability to straighten or extend your knee.   Your knee locks and you cannot straighten it.   Warmth and redness with pain and fever.   Deformity or dislocation of the kneecap.  DIAGNOSIS   Determining what is wrong may be very straight forward such as when there is an injury. It can also be challenging because of the complexity of the knee. Tests to make a diagnosis may include:   Your caregiver taking a history and doing a physical exam.   Routine X-rays can be used to rule out other problems. X-rays will not reveal a cartilage tear. Some injuries of the knee can be diagnosed by:   Arthroscopy a surgical technique by which a small video camera is inserted through tiny incisions on the sides of the knee. This procedure is used to examine and repair internal knee joint problems. Tiny instruments can be used during arthroscopy to repair the torn knee cartilage (meniscus).   Arthrography  is a radiology technique. A contrast liquid is directly injected into the knee joint. Internal structures of the knee joint then become visible on X-ray film.   An MRI scan is a non x-ray radiology procedure in which magnetic [place] and a computer produce two- or three-dimensional images of the inside of the knee. Cartilage tears are often visible using an MRI scanner. MRI scans have largely replaced arthrography in diagnosing cartilage tears of the knee.   Blood work.   Examination of the fluid that helps to lubricate the knee joint (synovial fluid). This is done by taking a sample out using a needle and a syringe.  TREATMENT  The treatment of knee problems depends on the cause. Some of these treatments are:   Depending on the injury, proper casting, splinting, surgery or physical therapy care will be needed.   Give yourself adequate recovery time. Do not overuse your joints. If you begin to get sore during workout routines, back off. Slow down or do fewer repetitions.   For repetitive activities such as cycling or running, maintain your strength and nutrition.   Alternate muscle groups. For example if you are a weight lifter, work the upper body on one day and the lower body the next.   Either tight or weak muscles do not give the proper support for your   knee. Tight or weak muscles do not absorb the stress placed on the knee joint. Keep the muscles surrounding the knee strong.   Take care of mechanical problems.   If you have flat feet, orthotics or special shoes may help. See your caregiver if you need help.   Arch supports, sometimes with wedges on the inner or outer aspect of the heel, can help. These can shift pressure away from the side of the knee most bothered by osteoarthritis.   A brace called an "unloader" brace also may be used to help ease the pressure on the most arthritic side of the knee.   If your caregiver has prescribed crutches, braces, wraps or ice, use as directed. The acronym for  this is PRICE. This means protection, rest, ice, compression and elevation.   Nonsteroidal anti-inflammatory drugs (NSAID's), can help relieve pain. But if taken immediately after an injury, they may actually increase swelling. Take NSAID's with food in your stomach. Stop them if you develop stomach problems. Do not take these if you have a history of ulcers, stomach pain or bleeding from the bowel. Do not take without your caregiver's approval if you have problems with fluid retention, heart failure, or kidney problems.   For ongoing knee problems, physical therapy may be helpful.   Glucosamine and chondroitin are over-the-counter dietary supplements. Both may help relieve the pain of osteoarthritis in the knee. These medicines are different from the usual anti-inflammatory drugs. Glucosamine may decrease the rate of cartilage destruction.   Injections of a corticosteroid drug into your knee joint may help reduce the symptoms of an arthritis flare-up. They may provide pain relief that lasts a few months. You may have to wait a few months between injections. The injections do have a small increased risk of infection, water retention and elevated blood sugar levels.   Hyaluronic acid injected into damaged joints may ease pain and provide lubrication. These injections may work by reducing inflammation. A series of shots may give relief for as long as 6 months.   Topical painkillers. Applying certain ointments to your skin may help relieve the pain and stiffness of osteoarthritis. Ask your pharmacist for suggestions. Many over the-counter products are approved for temporary relief of arthritis pain.   In some countries, doctors often prescribe topical NSAID's for relief of chronic conditions such as arthritis and tendinitis. A review of treatment with NSAID creams found that they worked as well as oral medications but without the serious side effects.  PREVENTION   Maintain a healthy weight. Extra pounds put  more strain on your joints.   Get strong, stay limber. Weak muscles are a common cause of knee injuries. Stretching is important. Include flexibility exercises in your workouts.   Be smart about exercise. If you have osteoarthritis, chronic knee pain or recurring injuries, you may need to change the way you exercise. This does not mean you have to stop being active. If your knees ache after jogging or playing basketball, consider switching to swimming, water aerobics or other low-impact activities, at least for a few days a week. Sometimes limiting high-impact activities will provide relief.   Make sure your shoes fit well. Choose footwear that is right for your sport.   Protect your knees. Use the proper gear for knee-sensitive activities. Use kneepads when playing volleyball or laying carpet. Buckle your seat belt every time you drive. Most shattered kneecaps occur in car accidents.   Rest when you are tired.  SEEK MEDICAL CARE IF:     You have knee pain that is continual and does not seem to be getting better.   SEEK IMMEDIATE MEDICAL CARE IF:   Your knee joint feels hot to the touch and you have a high fever.  MAKE SURE YOU:    Understand these instructions.   Will watch your condition.   Will get help right away if you are not doing well or get worse.  Document Released: 07/26/2007 Document Revised: 12/21/2011 Document Reviewed: 07/26/2007  Exit[employer] Patient Information 2014 [employer].

## 2013-03-07 NOTE — Progress Notes (Signed)
Subjective:     [name] is a 22 y.o. female here for follow up on knee pain. She states her bilateral knee pain is unresolved and medication prescribed has helped very little. She states physical therapy was advised however she has not heard anything from the office regarding referral for appointment.She states sitting for long periods of time aggravates knee pain. Personal health questionnaire reviewed: yes.   Gynecologic History Patient's last menstrual period was 01/13/2013. Contraception: Nexplanon Patient has never had a pap smear and mammogram is not indicated.   The following portions of the patient's history were reviewed and updated as appropriate: allergies, current medications, past family history, past medical history, past social history, past surgical history and problem list.  Review of Systems A comprehensive review of systems was negative except for: bilateral knee pain    Objective:    General appearance: alert and no distress Extremities: extremities normal, atraumatic, no cyanosis or edema                       Good ROM of both knees but pain with movement.  Assessment:    Bilateral knee pain.   Plan:    Referred to orthopedic surgery.

## 2013-03-20 ENCOUNTER — Encounter: Payer: Self-pay | Admitting: Obstetrics & Gynecology

## 2013-12-03 IMAGING — US US OB COMP LESS 14 WK
1 series · 14 of 28 positions shown · non-contrast
Comparison: none

[Series 1: us ob comp less 14 wks · 14 of 35 slices shown]
[im 2/35]
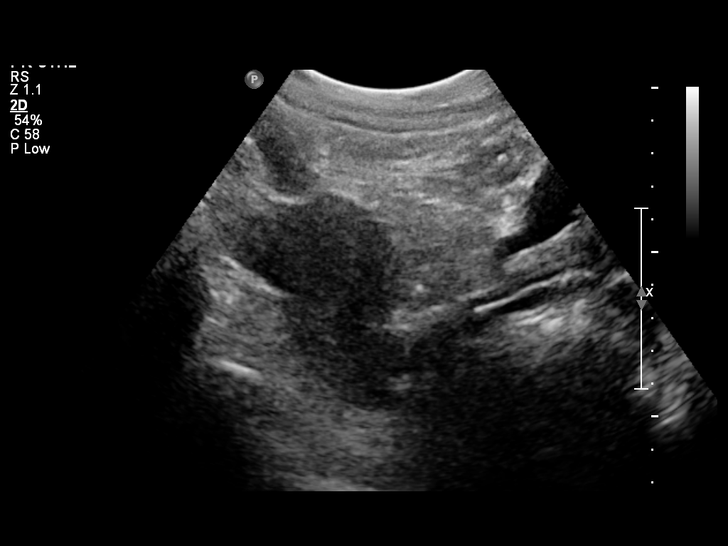
[im 4/35]
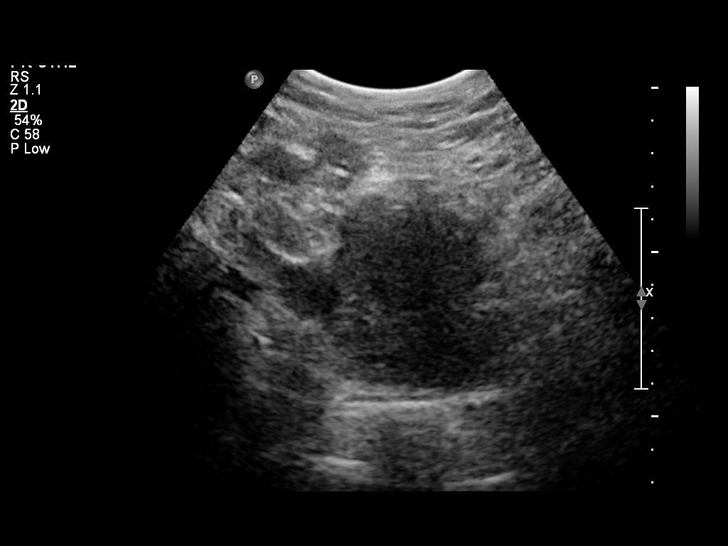
[im 7/35]
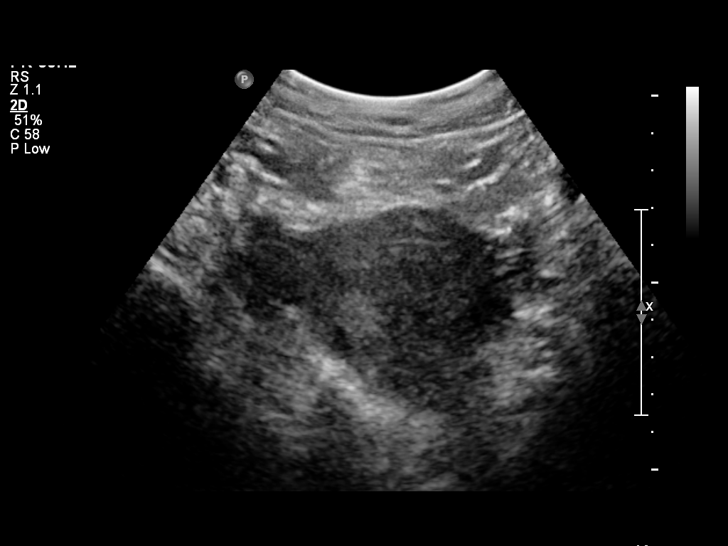
[im 9/35]
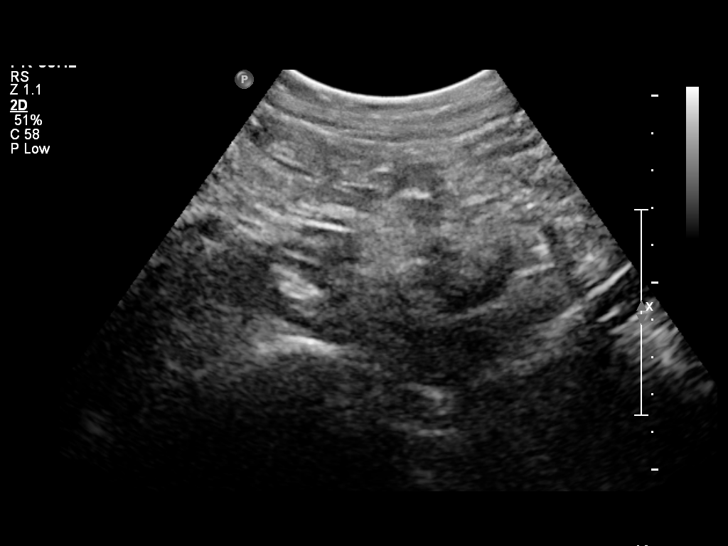
[im 12/35]
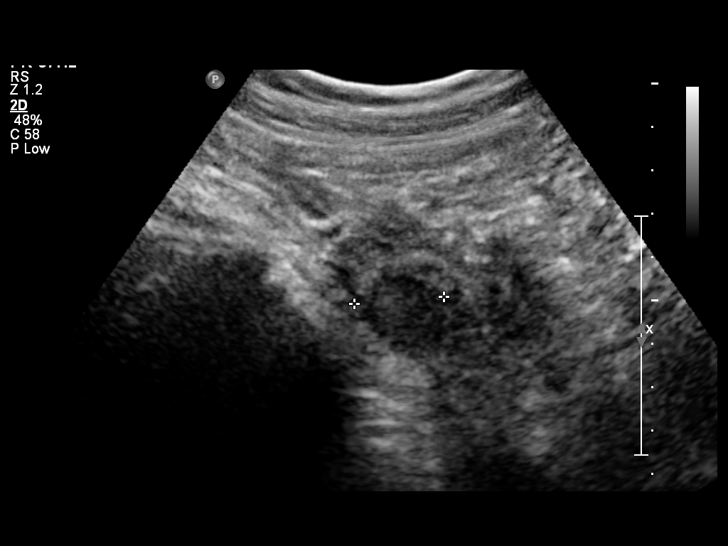
[im 14/35]
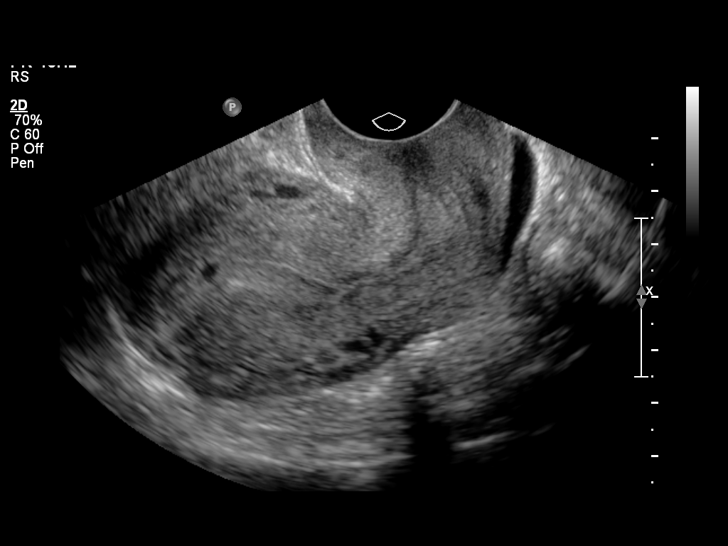
[im 17/35]
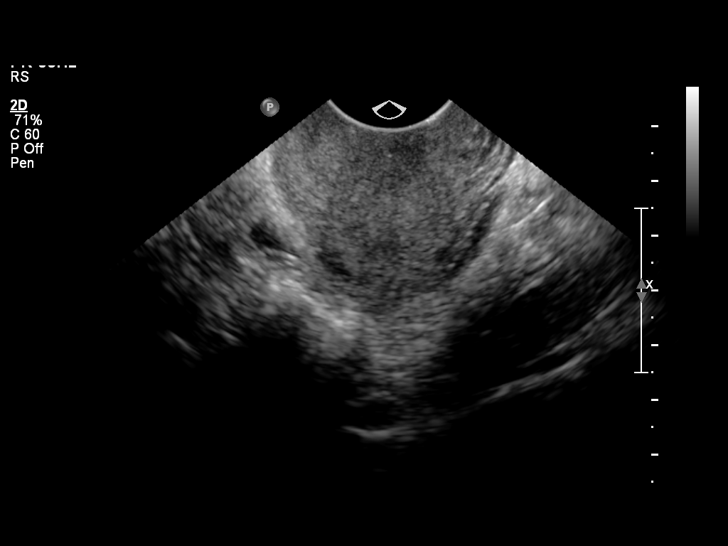
[im 19/35]
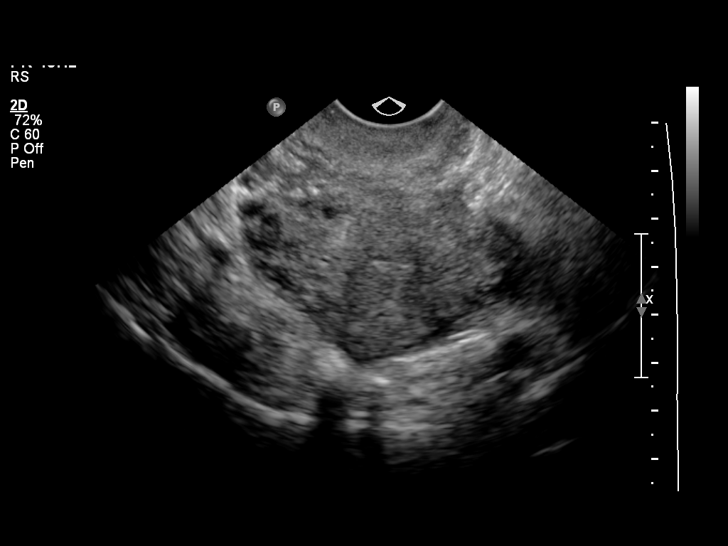
[im 22/35]
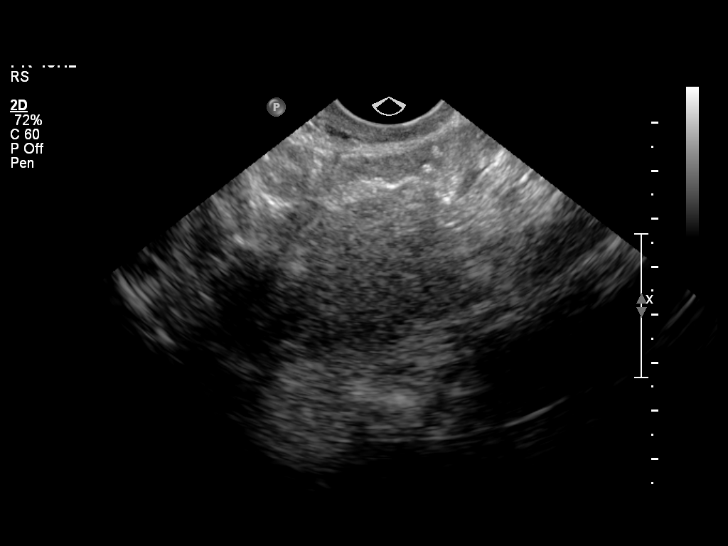
[im 24/35]
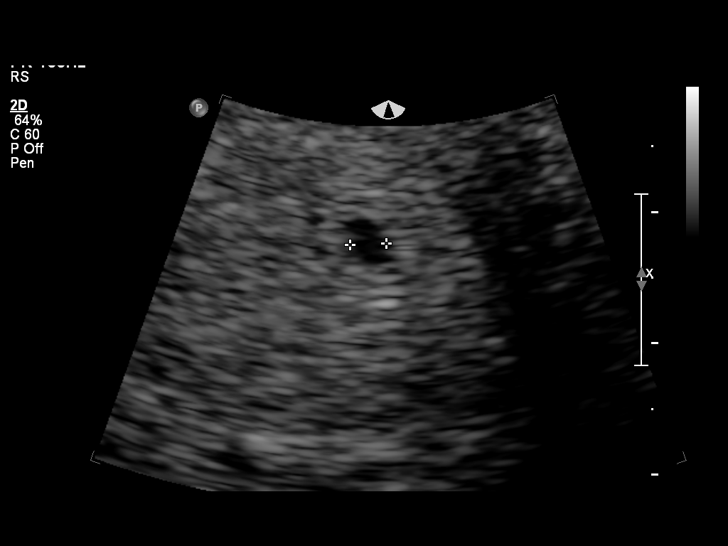
[im 27/35]
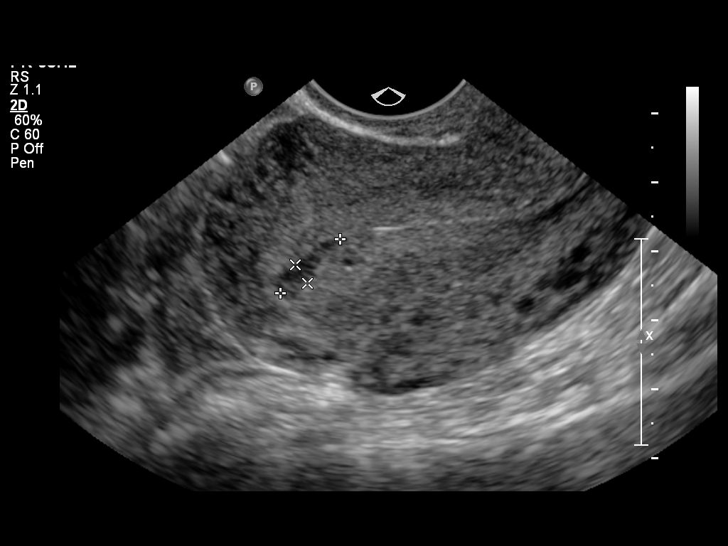
[im 29/35]
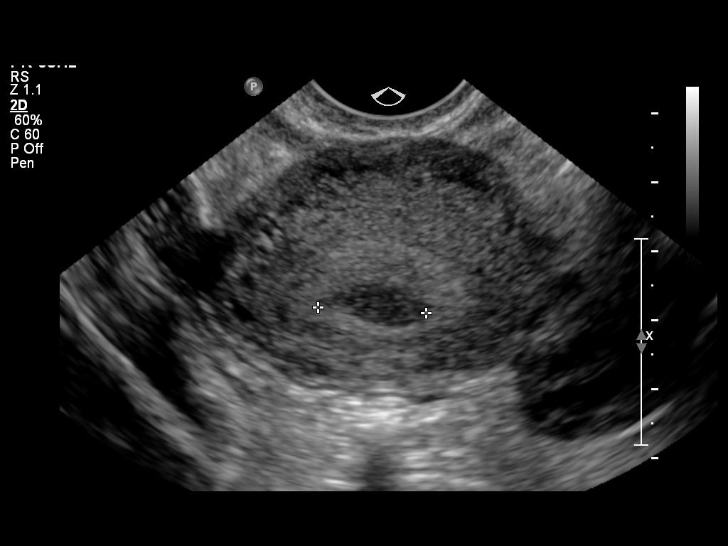
[im 32/35]
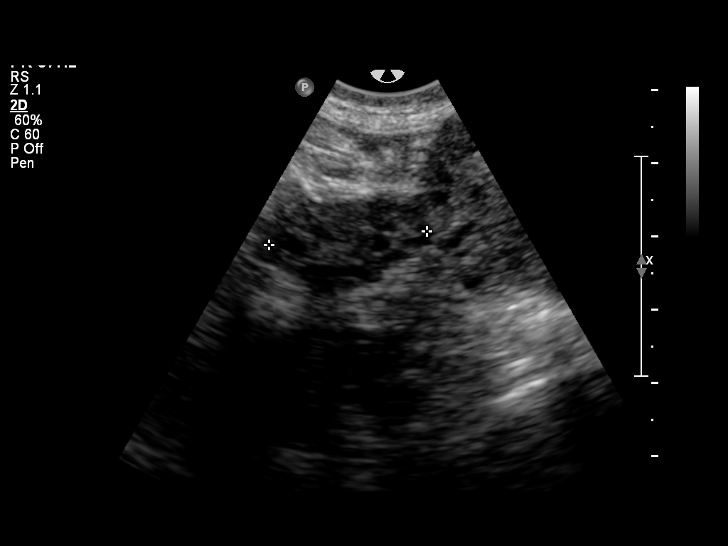
[im 35/35]
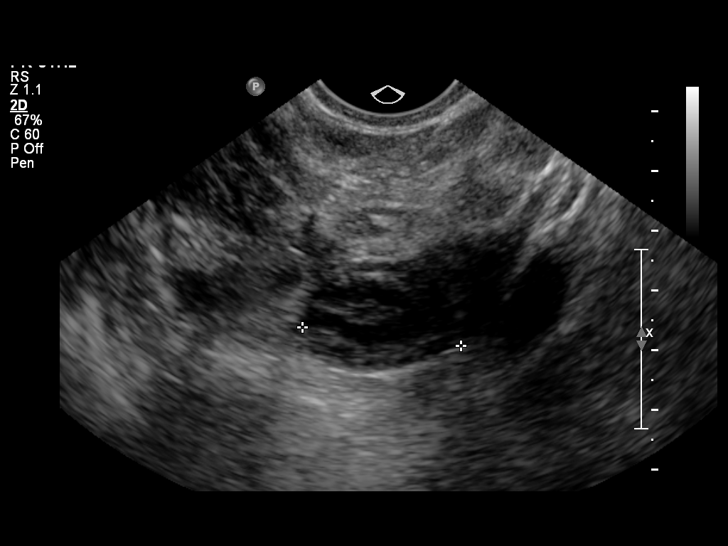

[14 of 28 positions shown; findings below may reference images not displayed]

OBSTETRICS REPORT
                      (Signed Final 01/02/2012 [DATE])

                 31_E
Procedures

 US OB COMP LESS 14 WKS                                76801.0
 US OB TRANSVAGINAL                                    76817.0
Indications

 Pain - Abdominal/Pelvic
Fetal Evaluation

 Gest. Sac:         Probable early IUGS
 Yolk Sac:          Not visualized
 Fetal Pole:        Not visualized
 Cardiac Activity:  No embryo visualized

 Comment:    Small subchorionic hemorrhage noted.
Biometry

 GS:         3  mm    G. Age:   4w 4d                  EDD:   09/06/12
Cervix Uterus Adnexa

 Cervix:       Normal appearance by transvaginal scan
 Cul De Sac:   Trace amount of free fluid seen.
 Left Ovary:   Within normal limits. Small corpus luteum noted.
 Right Ovary:  Within normal limits.

 Adnexa:     No abnormality visualized.
Impression

 Probable early 5wk IUGS and subchorionic hemorrhage.
 No adnexal mass or free fluid identified.
Recommendations

 Close follow-up of quantitative B HCG levels.  Follow-up US
 no earlier than 7-10 days, unless indicated clinically for other
 signs or symptoms.

## 2014-07-11 ENCOUNTER — Telehealth: Payer: Self-pay | Admitting: *Deleted

## 2014-07-11 NOTE — Telephone Encounter (Signed)
Patient states she has the Nexplanon and is having possible SE. 6:33 Patient is c/o breast discharge and soreness. Patient also has nausea. Patient states she has been having monthly cycles with the Nexplanon and this month she did not. Recommend patient do UPT- appointment given for follow up. Patient instructed not to stimulate her breast or try to express discharge- and to call back if her UPT was positive.

## 2014-07-16 ENCOUNTER — Telehealth: Payer: Self-pay | Admitting: *Deleted

## 2014-07-16 NOTE — Telephone Encounter (Signed)
Pt called to office stating that she had taken a UPT at home and was negative.  Pt states that she has not yet had a cycle and is still having discharge from breast.   Return call to pt.  LM on VM to call office.

## 2014-07-17 ENCOUNTER — Inpatient Hospital Stay (HOSPITAL_COMMUNITY)
Admission: AD | Admit: 2014-07-17 | Discharge: 2014-07-17 | Disposition: A | Discharge status: Home / Self Care | Payer: Medicaid Other | Source: Ambulatory Visit | Attending: Obstetrics | Admitting: Obstetrics

## 2014-07-17 ENCOUNTER — Encounter (HOSPITAL_COMMUNITY): Payer: Self-pay | Admitting: *Deleted

## 2014-07-17 DIAGNOSIS — N6452 Nipple discharge: Secondary | ICD-10-CM

## 2014-07-17 DIAGNOSIS — N912 Amenorrhea, unspecified: Secondary | ICD-10-CM | POA: Diagnosis not present

## 2014-07-17 DIAGNOSIS — Z3202 Encounter for pregnancy test, result negative: Secondary | ICD-10-CM | POA: Diagnosis not present

## 2014-07-17 DIAGNOSIS — N926 Irregular menstruation, unspecified: Secondary | ICD-10-CM | POA: Insufficient documentation

## 2014-07-17 LAB — URINALYSIS, ROUTINE W REFLEX MICROSCOPIC
BILIRUBIN URINE: NEGATIVE
Glucose, UA: NEGATIVE mg/dL
HGB URINE DIPSTICK: NEGATIVE
Ketones, ur: NEGATIVE mg/dL
Leukocytes, UA: NEGATIVE
Nitrite: NEGATIVE
PROTEIN: NEGATIVE mg/dL
Specific Gravity, Urine: 1.02 (ref 1.005–1.030)
UROBILINOGEN UA: 0.2 mg/dL (ref 0.0–1.0)
pH: 7.5 (ref 5.0–8.0)

## 2014-07-17 LAB — POCT PREGNANCY, URINE: Preg Test, Ur: NEGATIVE

## 2014-07-17 NOTE — MAU Note (Signed)
Pt has c/o nipples hurting with clear discharge since 9-25. Has a dull pulling sensation in stomach started on 10-2. Nausea and vomiting on 10-2 also. LMP: 8-22. Uses nexplanon-was placed 12-19-2012 by Dr Clearance CootsHarper. No abnormal discharge or bleeding. Denies urinary s/s.

## 2014-07-17 NOTE — Discharge Instructions (Signed)
Etonogestrel implant What is this medicine? ETONOGESTREL (et oh noe JES trel) is a contraceptive (birth control) device. It is used to prevent pregnancy. It can be used for up to 3 years. This medicine may be used for other purposes; ask your health care provider or pharmacist if you have questions. COMMON BRAND NAME(S): Implanon, Nexplanon What should I tell my health care provider before I take this medicine? They need to know if you have any of these conditions: -abnormal vaginal bleeding -blood vessel disease or blood clots -cancer of the breast, cervix, or liver -depression -diabetes -gallbladder disease -headaches -heart disease or recent heart attack -high blood pressure -high cholesterol -kidney disease -liver disease -renal disease -seizures -tobacco smoker -an unusual or allergic reaction to etonogestrel, other hormones, anesthetics or antiseptics, medicines, foods, dyes, or preservatives -pregnant or trying to get pregnant -breast-feeding How should I use this medicine? This device is inserted just under the skin on the inner side of your upper arm by a health care professional. Talk to your pediatrician regarding the use of this medicine in children. Special care may be needed. Overdosage: If you think you've taken too much of this medicine contact a poison control center or emergency room at once. Overdosage: If you think you have taken too much of this medicine contact a poison control center or emergency room at once. NOTE: This medicine is only for you. Do not share this medicine with others. What if I miss a dose? This does not apply. What may interact with this medicine? Do not take this medicine with any of the following medications: -amprenavir -bosentan -fosamprenavir This medicine may also interact with the following medications: -barbiturate medicines for inducing sleep or treating seizures -certain medicines for fungal infections like ketoconazole and  itraconazole -griseofulvin -medicines to treat seizures like carbamazepine, felbamate, oxcarbazepine, phenytoin, topiramate -modafinil -phenylbutazone -rifampin -some medicines to treat HIV infection like atazanavir, indinavir, lopinavir, nelfinavir, tipranavir, ritonavir -St. John's wort This list may not describe all possible interactions. Give your health care provider a list of all the medicines, herbs, non-prescription drugs, or dietary supplements you use. Also tell them if you smoke, drink alcohol, or use illegal drugs. Some items may interact with your medicine. What should I watch for while using this medicine? This product does not protect you against HIV infection (AIDS) or other sexually transmitted diseases. You should be able to feel the implant by pressing your fingertips over the skin where it was inserted. Tell your doctor if you cannot feel the implant. What side effects may I notice from receiving this medicine? Side effects that you should report to your doctor or health care professional as soon as possible: -allergic reactions like skin rash, itching or hives, swelling of the face, lips, or tongue -breast lumps -changes in vision -confusion, trouble speaking or understanding -dark urine -depressed mood -general ill feeling or flu-like symptoms -light-colored stools -loss of appetite, nausea -right upper belly pain -severe headaches -severe pain, swelling, or tenderness in the abdomen -shortness of breath, chest pain, swelling in a leg -signs of pregnancy -sudden numbness or weakness of the face, arm or leg -trouble walking, dizziness, loss of balance or coordination -unusual vaginal bleeding, discharge -unusually weak or tired -yellowing of the eyes or skin Side effects that usually do not require medical attention (Report these to your doctor or health care professional if they continue or are bothersome.): -acne -breast pain -changes in  weight -cough -fever or chills -headache -irregular menstrual bleeding -itching, burning, and   vaginal discharge -pain or difficulty passing urine -sore throat This list may not describe all possible side effects. Call your doctor for medical advice about side effects. You may report side effects to FDA at 1-800-FDA-1088. Where should I keep my medicine? This drug is given in a hospital or clinic and will not be stored at home. NOTE: This sheet is a summary. It may not cover all possible information. If you have questions about this medicine, talk to your doctor, pharmacist, or health care provider.  2015, Elsevier/Gold Standard. (2012-04-04 15:37:45)  

## 2014-07-17 NOTE — Telephone Encounter (Signed)
Patient encouraged to keep appointment for 07-26-14 to check out the breast discharge.

## 2014-07-17 NOTE — [facility]U Provider Note (Signed)
History     CSN: [id number]  Arrival date and time: 07/17/14 2115   First Provider Initiated Contact with Patient 07/17/14 2158      Chief Complaint  Patient presents with  . Nausea  . Emesis  . Possible Pregnancy   HPI [name] is a 23 y.o. [phone] who presents to [facility]U today with complaint of nipple discharge, amenorrhea and lower abdominal cramping. The patient states that nipple pain started on 07/06/14. She then noted a small amount of clear discharge expressed from both nipples a few days later. She denies breast tenderness, but states continued nipple sensitivity. She denies bleeding or lumps noted on self-exam. She states LMP of 06/02/14. She has Nexplanon in place for birth control since 12/19/12. She states that her periods are generally every 35-40 days and last for 4 days with moderate bleeding. She denies vaginal discharge, bleeding, fever or UTI symptoms today. She states N/V 2 days ago that resolved and none since. She states mild lower abdominal cramping occasionally since 07/12/14. She has not taken anything for pain. She rates pain at 4/10 now. She has an appointment scheduled on 07/26/14 with [facility].   OB History   Grav Para Term Preterm Abortions TAB SAB Ect Mult Living   2 2 2  0 0 0 0 0 0 2      Past Medical History  Diagnosis Date  . Mental disorder     depression  . Depression   . Depression   . Sickle cell trait     Past Surgical History  Procedure Laterality Date  . No past surgeries    . Wisdom tooth extraction      Family History  Problem Relation Age of Onset  . Anesthesia problems Neg Hx   . Hypotension Neg Hx   . Malignant hyperthermia Neg Hx   . Pseudochol deficiency Neg Hx     History  Substance Use Topics  . Smoking status: Never Smoker   . Smokeless tobacco: Never Used  . Alcohol Use: No    Allergies: No Known Allergies  Prescriptions prior to admission  Medication Sig Dispense Refill  . cyclobenzaprine (FLEXERIL) 10  MG tablet Take 1 tablet (10 mg total) by mouth every 8 (eight) hours as needed for muscle spasms.  30 tablet  1  . etonogestrel (NEXPLANON) 68 MG IMPL implant Inject 1 each into the skin once.      Marland Kitchen. ibuprofen (ADVIL,MOTRIN) 800 MG tablet Take 1 tablet (800 mg total) by mouth every 8 (eight) hours as needed for pain.  30 tablet  5  . Prenatal Vit-Fe Fumarate-FA (PRENATAL MULTIVITAMIN) TABS Take 1 tablet by mouth daily.  30 tablet  3    Review of Systems  Constitutional: Negative for fever and malaise/fatigue.  Gastrointestinal: Positive for abdominal pain. Negative for nausea, vomiting, diarrhea and constipation.  Genitourinary: Negative for dysuria, urgency and frequency.       Neg - vaginal bleeding, discharge   Physical Exam   Blood pressure 134/77, pulse 97, temperature 98.6 F (37 C), temperature source Oral, resp. rate 18, height 5\' 5"  (1.651 m), weight 196 lb 9.6 oz (89.177 kg), last menstrual period 06/02/2014, SpO2 100.00%, not currently breastfeeding.  Physical Exam  Constitutional: She is oriented to person, place, and time. She appears well-developed and well-nourished. No distress.  HENT:  Head: Normocephalic.  Cardiovascular: Normal rate.   Respiratory: Effort normal. Right breast exhibits no inverted nipple, no mass, no nipple discharge, no skin change and  no tenderness. Left breast exhibits no inverted nipple, no mass, no nipple discharge, no skin change and no tenderness. Breasts are symmetrical.  GI: Soft. She exhibits no distension and no mass. There is no tenderness. There is no rebound and no guarding.  Genitourinary: No breast swelling, tenderness, discharge or bleeding.  Neurological: She is alert and oriented to person, place, and time.  Skin: Skin is warm and dry. No erythema.  Psychiatric: She has a normal mood and affect.   Results for orders placed during the hospital encounter of 07/17/14 (from the past 24 hour(s))  URINALYSIS, ROUTINE W REFLEX MICROSCOPIC      Status: None   Collection Time    07/17/14  9:33 PM      Result Value Ref Range   Color, Urine YELLOW  YELLOW   APPearance CLEAR  CLEAR   Specific Gravity, Urine 1.020  1.005 - 1.030   pH 7.5  5.0 - 8.0   Glucose, UA NEGATIVE  NEGATIVE mg/dL   Hgb urine dipstick NEGATIVE  NEGATIVE   Bilirubin Urine NEGATIVE  NEGATIVE   Ketones, ur NEGATIVE  NEGATIVE mg/dL   Protein, ur NEGATIVE  NEGATIVE mg/dL   Urobilinogen, UA 0.2  0.0 - 1.0 mg/dL   Nitrite NEGATIVE  NEGATIVE   Leukocytes, UA NEGATIVE  NEGATIVE  POCT PREGNANCY, URINE     Status: None   Collection Time    07/17/14  9:42 PM      Result Value Ref Range   Preg Test, Ur NEGATIVE  NEGATIVE    MAU Course  Procedures None  MDM UPT - negative UA today  Assessment and Plan  A: Negative pregnancy test Irregular menses with Nexplanon Nipple tenderness  P: Discharge home Patient advised to take Ibuprofen PRN for pain Patient encouraged to follow-up with Dr. Clearance Coots as scheduled on 07/26/14 Patient may return to MAU as needed or if her condition were to change or worsen   Marny Lowenstein, PA-C  07/17/2014, 9:58 PM

## 2014-07-18 ENCOUNTER — Telehealth: Payer: Self-pay | Admitting: *Deleted

## 2014-07-18 NOTE — Telephone Encounter (Signed)
Patient called requesting a Nexplanon Removal. Patient scheduled for 07-20-14 @ 1 pm.

## 2014-07-20 ENCOUNTER — Ambulatory Visit: Payer: Self-pay | Admitting: Obstetrics & Gynecology

## 2014-07-26 ENCOUNTER — Ambulatory Visit: Payer: Medicaid Other | Admitting: Obstetrics & Gynecology

## 2014-08-13 ENCOUNTER — Encounter (HOSPITAL_COMMUNITY): Payer: Self-pay | Admitting: *Deleted

## 2014-10-08 ENCOUNTER — Encounter: Payer: Self-pay | Admitting: *Deleted

## 2014-10-09 ENCOUNTER — Encounter: Payer: Self-pay | Admitting: Obstetrics & Gynecology

## 2015-01-23 ENCOUNTER — Ambulatory Visit: Payer: Self-pay | Admitting: Obstetrics

## 2015-01-25 ENCOUNTER — Encounter: Payer: Self-pay | Admitting: Obstetrics

## 2015-01-25 ENCOUNTER — Ambulatory Visit (INDEPENDENT_AMBULATORY_CARE_PROVIDER_SITE_OTHER): Payer: Medicaid Other | Admitting: Obstetrics

## 2015-01-25 VITALS — BP 134/84 | HR 82 | Temp 98.8°F | Ht 65.0 in | Wt 195.0 lb

## 2015-01-25 DIAGNOSIS — Z3046 Encounter for surveillance of implantable subdermal contraceptive: Secondary | ICD-10-CM

## 2015-01-25 DIAGNOSIS — Z3049 Encounter for surveillance of other contraceptives: Secondary | ICD-10-CM | POA: Diagnosis not present

## 2015-01-25 DIAGNOSIS — R251 Tremor, unspecified: Secondary | ICD-10-CM

## 2015-01-25 DIAGNOSIS — Z30011 Encounter for initial prescription of contraceptive pills: Secondary | ICD-10-CM

## 2015-01-25 MED ORDER — LEVONORGESTREL-ETHINYL ESTRAD 0.15-30 MG-MCG PO TABS: 1.0000 | ORAL_TABLET | Freq: Every day | ORAL | Status: DC

## 2015-01-25 NOTE — Addendum Note (Signed)
Addended by: Coral CeoHARPER, Lasonya Hubner A on: 01/25/2015 02:42 PM   Modules accepted: Orders

## 2015-01-25 NOTE — Progress Notes (Signed)
NEXPLANON REMOVAL   Reasons  for removal:  Wants to start OCP's.   A timeout was performed confirming the patient, the procedure and allergy status. The patient's right  arm was palpated and the implant device located. The area was prepped with Betadinex3. The distal end of the device was palpated and 1 cc of 1% lidocaine was injected. A 2 mm incision was made. Any fibrotic tissue was carefully dissected away using blunt and/or sharp dissection. The device was removed in an intact manner. Steri-strips and a sterile dressing were applied to the incision. The patient tolerated the procedure well.  New contraceptive method: oral contraceptives (estrogen/progesterone)

## 2015-02-07 ENCOUNTER — Ambulatory Visit: Payer: Medicaid Other | Admitting: Obstetrics

## 2015-02-17 ENCOUNTER — Emergency Department (INDEPENDENT_AMBULATORY_CARE_PROVIDER_SITE_OTHER)
Admission: EM | Admit: 2015-02-17 | Discharge: 2015-02-17 | Disposition: A | Discharge status: Home / Self Care | Payer: Medicaid Other | Source: Home / Self Care | Attending: Family Medicine | Admitting: Family Medicine

## 2015-02-17 ENCOUNTER — Encounter (HOSPITAL_COMMUNITY): Payer: Self-pay | Admitting: Emergency Medicine

## 2015-02-17 ENCOUNTER — Emergency Department (INDEPENDENT_AMBULATORY_CARE_PROVIDER_SITE_OTHER): Payer: Medicaid Other

## 2015-02-17 DIAGNOSIS — S8391XA Sprain of unspecified site of right knee, initial encounter: Secondary | ICD-10-CM | POA: Diagnosis not present

## 2015-02-17 DIAGNOSIS — S93401A Sprain of unspecified ligament of right ankle, initial encounter: Secondary | ICD-10-CM

## 2015-02-17 MED ORDER — DICLOFENAC POTASSIUM 50 MG PO TABS: 50.0000 mg | ORAL_TABLET | Freq: Three times a day (TID) | ORAL | Status: DC

## 2015-02-17 NOTE — Discharge Instructions (Signed)
Ankle Sprain °An ankle sprain is an injury to the strong, fibrous tissues (ligaments) that hold the bones of your ankle joint together.  °CAUSES °An ankle sprain is usually caused by a fall or by twisting your ankle. Ankle sprains most commonly occur when you step on the outer edge of your foot, and your ankle turns inward. People who participate in sports are more prone to these types of injuries.  °SYMPTOMS  °· Pain in your ankle. The pain may be present at rest or only when you are trying to stand or walk. °· Swelling. °· Bruising. Bruising may develop immediately or within 1 to 2 days after your injury. °· Difficulty standing or walking, particularly when turning corners or changing directions. °DIAGNOSIS  °Your caregiver will ask you details about your injury and perform a physical exam of your ankle to determine if you have an ankle sprain. During the physical exam, your caregiver will press on and apply pressure to specific areas of your foot and ankle. Your caregiver will try to move your ankle in certain ways. An X-ray exam may be done to be sure a bone was not broken or a ligament did not separate from one of the bones in your ankle (avulsion fracture).  °TREATMENT  °Certain types of braces can help stabilize your ankle. Your caregiver can make a recommendation for this. Your caregiver may recommend the use of medicine for pain. If your sprain is severe, your caregiver may refer you to a surgeon who helps to restore function to parts of your skeletal system (orthopedist) or a physical therapist. °HOME CARE INSTRUCTIONS  °· Apply ice to your injury for 1-2 days or as directed by your caregiver. Applying ice helps to reduce inflammation and pain. °¨ Put ice in a plastic bag. °¨ Place a towel between your skin and the bag. °¨ Leave the ice on for 15-20 minutes at a time, every 2 hours while you are awake. °· Only take over-the-counter or prescription medicines for pain, discomfort, or fever as directed by  your caregiver. °· Elevate your injured ankle above the level of your heart as much as possible for 2-3 days. °· If your caregiver recommends crutches, use them as instructed. Gradually put weight on the affected ankle. Continue to use crutches or a cane until you can walk without feeling pain in your ankle. °· If you have a plaster splint, wear the splint as directed by your caregiver. Do not rest it on anything harder than a pillow for the first 24 hours. Do not put weight on it. Do not get it wet. You may take it off to take a shower or bath. °· You may have been given an elastic bandage to wear around your ankle to provide support. If the elastic bandage is too tight (you have numbness or tingling in your foot or your foot becomes cold and blue), adjust the bandage to make it comfortable. °· If you have an air splint, you may blow more air into it or let air out to make it more comfortable. You may take your splint off at night and before taking a shower or bath. Wiggle your toes in the splint several times per day to decrease swelling. °SEEK MEDICAL CARE IF:  °· You have rapidly increasing bruising or swelling. °· Your toes feel extremely cold or you lose feeling in your foot. °· Your pain is not relieved with medicine. °SEEK IMMEDIATE MEDICAL CARE IF: °· Your toes are numb or blue. °·   You have severe pain that is increasing. °MAKE SURE YOU:  °· Understand these instructions. °· Will watch your condition. °· Will get help right away if you are not doing well or get worse. °Document Released: 09/28/2005 Document Revised: 06/22/2012 Document Reviewed: 10/10/2011 °ExitCare® Patient Information ©2015 [employer]. This information is not intended to replace advice given to you by your health care provider. Make sure you discuss any questions you have with your health care provider. °Knee Sprain °A knee sprain is a tear in one of the strong, fibrous tissues that connect the bones (ligaments) in your knee. The  severity of the sprain depends on how much of the ligament is torn. The tear can be either partial or complete. °CAUSES  °Often, sprains are a result of a fall or injury. The force of the impact causes the fibers of your ligament to stretch too much. This excess tension causes the fibers of your ligament to tear. °SIGNS AND SYMPTOMS  °You may have some loss of motion in your knee. Other symptoms include: °· Bruising. °· Pain in the knee area. °· Tenderness of the knee to the touch. °· Swelling. °DIAGNOSIS  °To diagnose a knee sprain, your health care provider will physically examine your knee. Your health care provider may also suggest an X-ray exam of your knee to make sure no bones are broken. °TREATMENT  °If your ligament is only partially torn, treatment usually involves keeping the knee in a fixed position (immobilization) or bracing your knee for activities that require movement for several weeks. To do this, your health care provider will apply a bandage, cast, or splint to keep your knee from moving and to support your knee during movement until it heals. For a partially torn ligament, the healing process usually takes 4-6 weeks. °If your ligament is completely torn, depending on which ligament it is, you may need surgery to reconnect the ligament to the bone or reconstruct it. After surgery, a cast or splint may be applied and will need to stay on your knee for 4-6 weeks while your ligament heals. °HOME CARE INSTRUCTIONS °· Keep your injured knee elevated to decrease swelling. °· To ease pain and swelling, apply ice to the injured area: °¨ Put ice in a plastic bag. °¨ Place a towel between your skin and the bag. °¨ Leave the ice on for 20 minutes, 2-3 times a day. °· Only take medicine for pain as directed by your health care provider. °· Do not leave your knee unprotected until pain and stiffness go away (usually 4-6 weeks). °· If you have a cast or splint, do not allow it to get wet. If you have been  instructed not to remove it, cover it with a plastic bag when you shower or bathe. Do not swim. °· Your health care provider may suggest exercises for you to do during your recovery to prevent or limit permanent weakness and stiffness. °SEEK IMMEDIATE MEDICAL CARE IF: °· Your cast or splint becomes damaged. °· Your pain becomes worse. °· You have significant pain, swelling, or numbness below the cast or splint. °MAKE SURE YOU: °· Understand these instructions. °· Will watch your condition. °· Will get help right away if you are not doing well or get worse. °Document Released: 09/28/2005 Document Revised: 07/19/2013 Document Reviewed: 05/10/2013 °ExitCare® Patient Information ©2015 [employer]. This information is not intended to replace advice given to you by your health care provider. Make sure you discuss any questions you have with your   health care provider. ° °

## 2015-02-17 NOTE — ED Notes (Signed)
Reports falling down steps at home yesterday and rolling right ankle. Unable to bear weight.  Also having mild pain in right knee.  Mild relief with aleve.

## 2015-02-17 NOTE — ED Provider Notes (Signed)
CSN: [id number]     Arrival date & time 02/17/15  1515 History   First MD Initiated Contact with Patient 02/17/15 1545     Chief Complaint  Patient presents with  . Ankle Injury   (Consider location/radiation/quality/duration/timing/severity/associated sxs/prior Treatment) HPI Comments: 24 year old female states she fell down a short flight of stairs yesterday and injured her right ankle and knee. She is complaining of pain particularly with weightbearing to the right ankle and with ambulation to the ankle and the right knee. Denies injury elsewhere. She did not injure her head, neck or other extremities.   Past Medical History  Diagnosis Date  . Mental disorder     depression  . Depression   . Depression   . Sickle cell trait    Past Surgical History  Procedure Laterality Date  . No past surgeries    . Wisdom tooth extraction     Family History  Problem Relation Age of Onset  . Anesthesia problems Neg Hx   . Hypotension Neg Hx   . Malignant hyperthermia Neg Hx   . Pseudochol deficiency Neg Hx    History  Substance Use Topics  . Smoking status: Never Smoker   . Smokeless tobacco: Never Used  . Alcohol Use: No   OB History    Gravida Para Term Preterm AB TAB SAB Ectopic Multiple Living   2 2 2  0 0 0 0 0 0 2     Review of Systems  Constitutional: Positive for activity change. Negative for fever and chills.  HENT: Negative.   Respiratory: Negative.   Cardiovascular: Negative.   Musculoskeletal: Positive for joint swelling and gait problem. Negative for neck pain and neck stiffness.       As per HPI  Skin: Negative for color change, pallor and wound.  Neurological: Negative.  Negative for tremors, syncope and headaches.    Allergies  Review of patient's allergies indicates no known allergies.  Home Medications   Prior to Admission medications   Medication Sig Start Date End Date Taking? Authorizing Provider  diclofenac (CATAFLAM) 50 MG tablet Take 1 tablet (50  mg total) by mouth 3 (three) times daily. One tablet TID with food prn pain. 02/17/15   Hayden Rasmussenavid Katlyn Muldrew, NP  etonogestrel (NEXPLANON) 68 MG IMPL implant Inject 1 each into the skin once.    Historical Provider, MD  ibuprofen (ADVIL,MOTRIN) 800 MG tablet Take 1 tablet (800 mg total) by mouth every 8 (eight) hours as needed for pain. 02/07/13   Brock Badharles A Harper, MD  levonorgestrel-ethinyl estradiol (NORDETTE) 0.15-30 MG-MCG tablet Take 1 tablet by mouth daily. 01/25/15   Brock Badharles A Harper, MD   BP 114/67 mmHg  Pulse 67  Temp(Src) 98.2 F (36.8 C) (Oral)  Resp 16  SpO2 100%  LMP 02/10/2015 Physical Exam  Constitutional: She is oriented to person, place, and time. She appears well-developed and well-nourished. No distress.  HENT:  Head: Normocephalic and atraumatic.  Eyes: EOM are normal. Pupils are equal, round, and reactive to light.  Neck: Normal range of motion. Neck supple.  Cardiovascular: Normal rate.   Pulmonary/Chest: Effort normal. No respiratory distress.  Musculoskeletal:  Right ankle with mild swelling and tenderness over the medial and lateral aspect. Decreased range of motion due to pain. No deformity. No foot tenderness, deformity, swelling or discoloration. Distal neurovascular motor sensory is grossly intact. Pedal pulses 2+.  Right knee with tenderness to the medial and lateral aspect. Tenderness to the proximal anterior tibia as well as tenderness to  the joint line medially and laterally. Minor swelling. No deformity. She is able to bear weight. No deformity. Active extension and flexion is complete and intact. No pain with torsion medially or laterally. No laxity. Negative drawer, negative varus negative valgus.  Neurological: She is alert and oriented to person, place, and time. No cranial nerve deficit.  Skin: Skin is warm and dry.  Psychiatric: She has a normal mood and affect.  Nursing note and vitals reviewed.   ED Course  Procedures (including critical care time) Labs  Review Labs Reviewed - No data to display  Imaging Review Dg Ankle Complete Right  02/17/2015   CLINICAL DATA:  Fall down stairs 1 day ago with lateral right ankle pain and soft tissue swelling.  EXAM: RIGHT ANKLE - COMPLETE 3+ VIEW  COMPARISON:  None.  FINDINGS: Mild soft tissue swelling overlying the lateral malleolus. The malleoli, plafond, and talar dome appear intact. The base of the fifth metatarsal appears intact.  Broad dorsal spurring of the proximal navicular.  IMPRESSION: 1. Soft tissue swelling laterally but no acute bony findings. 2. Broad chronic appearing dorsal spurring of the navicular.   Electronically Signed   By: Gaylyn RongWalter  Liebkemann M.D.   On: 02/17/2015 16:26   Dg Knee Complete 4 Views Right  02/17/2015   CLINICAL DATA:  Larey SeatFell down steps yesterday, knee pain, initial encounter  EXAM: RIGHT KNEE - COMPLETE 4+ VIEW  COMPARISON:  None.  FINDINGS: There is no evidence of fracture, dislocation, or joint effusion. There is no evidence of arthropathy or other focal bone abnormality. Soft tissues are unremarkable.  IMPRESSION: No acute abnormality noted.   Electronically Signed   By: Alcide CleverMark  Lukens M.D.   On: 02/17/2015 16:29     MDM   1. Right knee sprain, initial encounter   2. Right ankle sprain, initial encounter    Right knee sleeve and ASO to the right ankle. Ice, elevation and limited activity for the next few days. That includes weightbearing and ambulation. Follow-up here PCP or if getting worse many referral to orthopedics. Cataflam 50 mg 3 times a day when necessary    Hayden Rasmussenavid Zayne Draheim, NP 02/17/15 1649

## 2015-03-01 ENCOUNTER — Telehealth: Payer: Self-pay | Admitting: Neurology

## 2015-03-01 ENCOUNTER — Ambulatory Visit: Payer: Medicaid Other | Admitting: Neurology

## 2015-03-01 ENCOUNTER — Encounter: Payer: Self-pay | Admitting: Neurology

## 2015-03-01 NOTE — Telephone Encounter (Signed)
Pt no showed 03/01/15 NP appt w/ Dr. Everlena CooperJaffe. Referring providers office notified via EPIC referral. No show letter and policy mailed to pt / Sherri S.

## 2015-03-05 ENCOUNTER — Telehealth: Payer: Self-pay | Admitting: Neurology

## 2015-03-05 NOTE — Telephone Encounter (Signed)
Pt no showed NP app w/ Dr. Everlena CooperJaffe on 03/01/15. No show letter mailed to pt. Referring providers office notified via EPIC referral / Sherri S.

## 2015-04-08 ENCOUNTER — Telehealth: Payer: Self-pay | Admitting: *Deleted

## 2015-04-08 NOTE — Telephone Encounter (Signed)
Patient missed her appointment at neurology - she had the wrong day marked. She will need another referral to reschedule. The patient is asking Dr Clearance Cootsharper to redo the referral so she can reschedule.

## 2015-04-09 NOTE — Telephone Encounter (Signed)
NO

## 2015-04-12 ENCOUNTER — Telehealth: Payer: Self-pay

## 2015-04-12 NOTE — Telephone Encounter (Signed)
Redid referral in EPIC and called [facility] about their No Show policy - they are going to ask the Dr who is seeing new patients - Dr. Shon MilletAdam Jaffe,  to see if he will take her - will follow up early next week with [place], if they don't then will find someone else

## 2015-04-12 NOTE — Telephone Encounter (Signed)
See what you can do- barb

## 2015-07-01 ENCOUNTER — Encounter: Payer: Self-pay | Admitting: Neurology

## 2015-07-01 ENCOUNTER — Ambulatory Visit (INDEPENDENT_AMBULATORY_CARE_PROVIDER_SITE_OTHER): Payer: Medicaid Other | Admitting: Neurology

## 2015-07-01 VITALS — BP 122/74 | HR 80 | Temp 98.5°F | Resp 16 | Ht 65.0 in | Wt 194.9 lb

## 2015-07-01 DIAGNOSIS — G5601 Carpal tunnel syndrome, right upper limb: Secondary | ICD-10-CM | POA: Insufficient documentation

## 2015-07-01 DIAGNOSIS — F17200 Nicotine dependence, unspecified, uncomplicated: Secondary | ICD-10-CM | POA: Insufficient documentation

## 2015-07-01 DIAGNOSIS — F444 Conversion disorder with motor symptom or deficit: Secondary | ICD-10-CM | POA: Insufficient documentation
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
## 2015-07-01 NOTE — Patient Instructions (Signed)
I think the tremors are related to anxiety The tingling in the hand may be carpal tunnel syndrome.  Try wearing a wrist splint at night.  If it persists, you can have Dr. Clearance Coots order specifically a nerve conduction study test with [place] Follow up as needed.

## 2016-01-22 ENCOUNTER — Encounter: Payer: Self-pay | Admitting: Obstetrics

## 2016-01-22 ENCOUNTER — Ambulatory Visit (INDEPENDENT_AMBULATORY_CARE_PROVIDER_SITE_OTHER): Payer: Medicaid Other | Admitting: Obstetrics

## 2016-01-22 VITALS — BP 118/76 | HR 84 | Temp 98.7°F | Wt 197.0 lb

## 2016-01-22 DIAGNOSIS — Z3041 Encounter for surveillance of contraceptive pills: Secondary | ICD-10-CM | POA: Diagnosis not present

## 2016-01-22 DIAGNOSIS — Z1389 Encounter for screening for other disorder: Secondary | ICD-10-CM

## 2016-01-22 DIAGNOSIS — Z3202 Encounter for pregnancy test, result negative: Secondary | ICD-10-CM | POA: Diagnosis not present

## 2016-01-22 DIAGNOSIS — Z3169 Encounter for other general counseling and advice on procreation: Secondary | ICD-10-CM

## 2016-01-22 DIAGNOSIS — Z309 Encounter for contraceptive management, unspecified: Secondary | ICD-10-CM | POA: Diagnosis not present

## 2016-01-22 DIAGNOSIS — Z3009 Encounter for other general counseling and advice on contraception: Secondary | ICD-10-CM

## 2016-01-22 LAB — POCT URINALYSIS DIPSTICK
Bilirubin, UA: NEGATIVE
Blood, UA: NEGATIVE
Glucose, UA: NEGATIVE
Ketones, UA: NEGATIVE
LEUKOCYTES UA: NEGATIVE
Nitrite, UA: NEGATIVE
PROTEIN UA: NEGATIVE
SPEC GRAV UA: 1.02
Urobilinogen, UA: NEGATIVE
pH, UA: 5

## 2016-01-22 LAB — POCT URINE PREGNANCY: Preg Test, Ur: NEGATIVE

## 2016-01-22 MED ORDER — VITAFOL-ONE 29-1-200 MG PO CAPS: 1.0000 | ORAL_CAPSULE | Freq: Every day | ORAL | Status: DC

## 2016-01-22 MED ORDER — LEVONORGESTREL-ETHINYL ESTRAD 0.15-30 MG-MCG PO TABS: 1.0000 | ORAL_TABLET | Freq: Every day | ORAL | Status: DC

## 2016-01-22 NOTE — Patient Instructions (Signed)
Natural Family Planning Natural Family Planning (NFP) is a type of birth control without using any form of contraception. Women who use NFP should not have sexual intercourse when the ovary produces an egg (ovulation) during the menstrual cycle. The NFP method is safe and can prevent pregnancy. It is 75% effective when practiced right. The man needs to also understand this method of birth control and the woman needs to be aware of how her body functions during her menstrual cycle. NFP can also be used as a method of getting pregnant.  HOW THE NFP METHOD WORKS  A woman's menstrual period usually happens every 28-30 days (it can vary from 23-35 days).  Ovulation happens 12-14 days before the start of the next menstrual period (the fertile period). The egg is fertile for 24 hours and the sperm can live for 3 days or more. If there is sexual intercourse at this time, pregnancy can occur. THERE ARE MANY TYPES OF NFP METHODS USED TO PREVENT PREGNANCY  The basal body temperature method. Often times, there is a slight increase of body temperature when a woman ovulates. Take your temperature every morning before getting out of bed. Write the temperature on a chart. An increase in the temperature shows ovulation has happened. Do not have sexual intercourse from the menstrual period up to three days after the increase in the temperature. Note that the body temperature may increase as a result of fever, restless sleep, and working schedules.  The ovulation cervical mucus method. During the menstrual cycle, the cervical mucus changes from dry and sticky to wet and slippery. Check the mucus of the vagina every day to look for these changes. Just before ovulation, the mucus becomes wet and slippery. On the last day of wetness, ovulation happens. To avoid getting pregnant, sexual intercourse is safe for about 10 days after the menstrual period and on the dry mucus days. Do not have sexual intercourse when the mucus  starts to show up and not until 4 days after the wet and slippery mucus goes away. Sexual intercourse after the 4 days have passed until the menstrual period starts is a safe time. Note that the mucus from the vagina can increase because of a vaginal or cervical infection, lubricants, some medicines, and sexual excitement.  The symptothermal method. This method uses both the temperature and the ovulation methods. Combine the two methods above to prevent pregnancy.  The calendar method. Record your menstrual periods and length of the cycles for 6 months. This is helpful when the menstrual cycle varies in the length of the cycle. The length of a menstrual cycle is from day 1 of the present menstrual period to day 1 of the next menstrual period. Then, find your fertile days of the month and do not have sexual intercourse during that time. You may need help from your health care provider to find out your fertile days. There are some signs of ovulation that may be helpful when trying to find the time of ovulation. This includes vaginal spotting or abdominal cramps during the middle of your menstrual cycle. Not all women have these symptoms. YOU SHOULD NOT USE NFP IF:  You have very irregular menstrual periods and may skip months.  You have abnormal bleeding.  You have a vaginal or cervical infection.  You are on medicines that can affect the vaginal mucus or body temperature. These medicines include antibiotics, thyroid medicines, and antihistamines (cold and allergy medicine).   This information is not intended to replace   advice given to you by your health care provider. Make sure you discuss any questions you have with your health care provider.   Document Released: 03/16/2008 Document Revised: 10/03/2013 Document Reviewed: 03/31/2013 Elsevier Interactive Patient Education 2016 Elsevier Inc.  

## 2016-01-23 ENCOUNTER — Encounter: Payer: Self-pay | Admitting: Obstetrics

## 2016-01-23 NOTE — Progress Notes (Signed)
Subjective:    [name] is a 25 y.o. female who presents for contraception counseling. The patient has no complaints today. The patient is sexually active. Pertinent past medical history: current smoker.  The information documented in the HPI was reviewed and verified.  Menstrual History: OB History    Gravida Para Term Preterm AB TAB SAB Ectopic Multiple Living   2 2 2  0 0 0 0 0 0 2      Patient's last menstrual period was 01/01/2016.   Patient Active Problem List   Diagnosis Date Noted  . 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 Tobacco dependence 07/01/2015  . Pain aggravated by physical activity 03/07/2013  . Backache 03/07/2013  . Low backache 02/07/2013  . Deliberate self-cutting 10/12/2011   Past Medical History  Diagnosis Date  . Mental disorder     depression  . Depression   . Depression   . Sickle cell trait [facility](HCC)     Past Surgical History  Procedure Laterality Date  . No past surgeries    . Wisdom tooth extraction       Current outpatient prescriptions:  .  ibuprofen (ADVIL,MOTRIN) 800 MG tablet, Take 1 tablet (800 mg total) by mouth every 8 (eight) hours as needed for pain., Disp: 30 tablet, Rfl: 5 .  levonorgestrel-ethinyl estradiol (NORDETTE) 0.15-30 MG-MCG tablet, Take 1 tablet by mouth daily., Disp: 1 Package, Rfl: 11 .  Prenatal Vit-FePoly-FA-DHA (VITAFOL-ONE) 29-1-200 MG CAPS, Take 1 capsule by mouth daily before breakfast., Disp: 30 capsule, Rfl: 11 No Known Allergies  Social History  Substance Use Topics  . Smoking status: Current Every Day Smoker -- 0.05 packs/day for 6 years    Types: Cigarettes  . Smokeless tobacco: Never Used  . Alcohol Use: No    Family History  Problem Relation Age of Onset  . Anesthesia problems Neg Hx   . Hypotension Neg Hx   . Malignant hyperthermia Neg Hx   . Pseudochol deficiency Neg Hx   . Bipolar disorder Mother   . Schizophrenia Mother        Review of  Systems Constitutional: negative for weight loss Genitourinary:negative for abnormal menstrual periods and vaginal discharge   Objective:   BP 118/76 mmHg  Pulse 84  Temp(Src) 98.7 F (37.1 C)  Wt 197 lb (89.359 kg)  LMP 01/01/2016   General:   alert  Skin:   no rash or abnormalities  Lungs:   clear to auscultation bilaterally  Heart:   regular rate and rhythm, S1, S2 normal, no murmur, click, rub or gallop  Breasts:   normal without suspicious masses, skin or nipple changes or axillary nodes  Abdomen:  normal findings: no organomegaly, soft, non-tender and no hernia  Pelvis:  External genitalia: normal general appearance Urinary system: urethral meatus normal and bladder without fullness, nontender Vaginal: normal without tenderness, induration or masses Cervix: normal appearance Adnexa: normal bimanual exam Uterus: anteverted and non-tender, normal size   Lab Review Urine pregnancy test Labs reviewed yes Radiologic studies reviewed no    Assessment:    25 y.o., continuing OCP (estrogen/progesterone), tobacco dependence.  Relative contraindication.  Preconception counseling and advice for consideration of future pregnancy.  Plan:    All questions answered. Contraception: OCP (estrogen/progesterone). Discussed healthy lifestyle modifications. Agricultural engineerducational material distributed. Follow up as needed.    Meds ordered this encounter  Medications  . levonorgestrel-ethinyl estradiol (NORDETTE) 0.15-30 MG-MCG tablet    Sig: Take 1 tablet by  mouth daily.    Dispense:  1 Package    Refill:  11  . Prenatal Vit-FePoly-FA-DHA (VITAFOL-ONE) 29-1-200 MG CAPS    Sig: Take 1 capsule by mouth daily before breakfast.    Dispense:  30 capsule    Refill:  11   Orders Placed This Encounter  Procedures  . POCT urine pregnancy  . POCT urinalysis dipstick

## 2016-07-17 ENCOUNTER — Ambulatory Visit (INDEPENDENT_AMBULATORY_CARE_PROVIDER_SITE_OTHER): Payer: Medicaid Other

## 2016-07-17 ENCOUNTER — Encounter: Payer: Self-pay | Admitting: *Deleted

## 2016-07-17 VITALS — BP 109/72 | HR 73 | Temp 98.7°F | Ht 65.0 in | Wt 187.9 lb

## 2016-07-17 DIAGNOSIS — Z3201 Encounter for pregnancy test, result positive: Secondary | ICD-10-CM | POA: Diagnosis not present

## 2016-07-17 DIAGNOSIS — N926 Irregular menstruation, unspecified: Secondary | ICD-10-CM | POA: Diagnosis not present

## 2016-07-17 LAB — POCT URINE PREGNANCY: PREG TEST UR: POSITIVE — AB

## 2016-07-17 NOTE — Progress Notes (Signed)
Patient in office today for upt, upt was positive. Patient advised to scheduled new ob visit on 08/26/16.

## 2016-08-13 ENCOUNTER — Inpatient Hospital Stay (HOSPITAL_COMMUNITY)
Admission: AD | Admit: 2016-08-13 | Discharge: 2016-08-13 | Discharge status: Against Medical Advice | Payer: Medicaid Other | Source: Ambulatory Visit | Attending: Obstetrics & Gynecology | Admitting: Obstetrics & Gynecology

## 2016-08-13 ENCOUNTER — Encounter (HOSPITAL_COMMUNITY): Payer: Self-pay | Admitting: *Deleted

## 2016-08-13 DIAGNOSIS — Z5321 Procedure and treatment not carried out due to patient leaving prior to being seen by health care provider: Secondary | ICD-10-CM | POA: Insufficient documentation

## 2016-08-13 LAB — URINALYSIS, ROUTINE W REFLEX MICROSCOPIC
BILIRUBIN URINE: NEGATIVE
GLUCOSE, UA: NEGATIVE mg/dL
Hgb urine dipstick: NEGATIVE
KETONES UR: NEGATIVE mg/dL
Leukocytes, UA: NEGATIVE
Nitrite: NEGATIVE
PH: 6 (ref 5.0–8.0)
PROTEIN: NEGATIVE mg/dL
Specific Gravity, Urine: 1.02 (ref 1.005–1.030)

## 2016-08-13 NOTE — MAU Note (Signed)
Pt called, not in lobby, did not inform staff she was leaving. 

## 2016-08-13 NOTE — MAU Note (Signed)
Pt not in Lobby x 2

## 2016-08-13 NOTE — MAU Note (Signed)
Pt C/O cold sx's, coughing , sneezing, sore throat when coughing - denies fever.  Has mid & lower back pain, has lower abd cramping this morning, none now.  Had some spotting last night, none today.  Has "uncontrollable" vomiting, no diarrhea.

## 2016-08-13 NOTE — MAU Note (Signed)
Pt called, not in lobby 

## 2016-08-14 ENCOUNTER — Encounter (HOSPITAL_COMMUNITY): Payer: Self-pay

## 2016-08-14 ENCOUNTER — Inpatient Hospital Stay (HOSPITAL_COMMUNITY)
Admission: AD | Admit: 2016-08-14 | Discharge: 2016-08-14 | Disposition: A | Discharge status: Home / Self Care | Payer: Medicaid Other | Source: Ambulatory Visit | Attending: Family Medicine | Admitting: Family Medicine

## 2016-08-14 ENCOUNTER — Inpatient Hospital Stay (HOSPITAL_COMMUNITY): Payer: Medicaid Other

## 2016-08-14 DIAGNOSIS — Z3A08 8 weeks gestation of pregnancy: Secondary | ICD-10-CM | POA: Diagnosis not present

## 2016-08-14 DIAGNOSIS — D573 Sickle-cell trait: Secondary | ICD-10-CM | POA: Insufficient documentation

## 2016-08-14 DIAGNOSIS — O99011 Anemia complicating pregnancy, first trimester: Secondary | ICD-10-CM | POA: Insufficient documentation

## 2016-08-14 DIAGNOSIS — R109 Unspecified abdominal pain: Secondary | ICD-10-CM | POA: Insufficient documentation

## 2016-08-14 DIAGNOSIS — O99341 Other mental disorders complicating pregnancy, first trimester: Secondary | ICD-10-CM | POA: Insufficient documentation

## 2016-08-14 DIAGNOSIS — O209 Hemorrhage in early pregnancy, unspecified: Secondary | ICD-10-CM | POA: Insufficient documentation

## 2016-08-14 DIAGNOSIS — Z3491 Encounter for supervision of normal pregnancy, unspecified, first trimester: Secondary | ICD-10-CM

## 2016-08-14 DIAGNOSIS — O26891 Other specified pregnancy related conditions, first trimester: Secondary | ICD-10-CM | POA: Diagnosis present

## 2016-08-14 DIAGNOSIS — B9689 Other specified bacterial agents as the cause of diseases classified elsewhere: Secondary | ICD-10-CM | POA: Diagnosis not present

## 2016-08-14 DIAGNOSIS — O99331 Smoking (tobacco) complicating pregnancy, first trimester: Secondary | ICD-10-CM | POA: Insufficient documentation

## 2016-08-14 DIAGNOSIS — O99511 Diseases of the respiratory system complicating pregnancy, first trimester: Secondary | ICD-10-CM | POA: Insufficient documentation

## 2016-08-14 DIAGNOSIS — O26899 Other specified pregnancy related conditions, unspecified trimester: Secondary | ICD-10-CM

## 2016-08-14 DIAGNOSIS — N760 Acute vaginitis: Secondary | ICD-10-CM

## 2016-08-14 DIAGNOSIS — M545 Low back pain: Secondary | ICD-10-CM | POA: Diagnosis not present

## 2016-08-14 LAB — URINALYSIS, ROUTINE W REFLEX MICROSCOPIC
BILIRUBIN URINE: NEGATIVE
GLUCOSE, UA: NEGATIVE mg/dL
HGB URINE DIPSTICK: NEGATIVE
Ketones, ur: NEGATIVE mg/dL
Nitrite: NEGATIVE
PROTEIN: NEGATIVE mg/dL
Specific Gravity, Urine: 1.015 (ref 1.005–1.030)
pH: 7.5 (ref 5.0–8.0)

## 2016-08-14 LAB — WET PREP, GENITAL
Sperm: NONE SEEN
TRICH WET PREP: NONE SEEN
Yeast Wet Prep HPF POC: NONE SEEN

## 2016-08-14 LAB — CBC
HEMATOCRIT: 37.9 % (ref 36.0–46.0)
HEMOGLOBIN: 13.2 g/dL (ref 12.0–15.0)
MCH: 30.7 pg (ref 26.0–34.0)
MCHC: 34.8 g/dL (ref 30.0–36.0)
MCV: 88.1 fL (ref 78.0–100.0)
Platelets: 222 10*3/uL (ref 150–400)
RBC: 4.3 MIL/uL (ref 3.87–5.11)
RDW: 12.9 % (ref 11.5–15.5)
WBC: 6.3 10*3/uL (ref 4.0–10.5)

## 2016-08-14 LAB — ABO/RH: ABO/RH(D): A POS

## 2016-08-14 LAB — URINE MICROSCOPIC-ADD ON

## 2016-08-14 LAB — HCG, QUANTITATIVE, PREGNANCY: hCG, Beta Chain, Quant, S: 179333 m[IU]/mL — ABNORMAL HIGH (ref <5)

## 2016-08-14 MED ORDER — METRONIDAZOLE 0.75 % VA GEL: 1.0000 | Freq: Two times a day (BID) | VAGINAL | 0 refills | Status: AC

## 2016-08-14 NOTE — MAU Note (Signed)
Right side flank pain x 3 days,no CVA tenderness, c/o cold x 1 week, when coughs vomits.  Lower abdominal cramping since around midnight last night, no spotting today, had spotting 2 days ago.

## 2016-08-14 NOTE — MAU Provider Note (Signed)
History     CSN: [id number]  Arrival date and time: 08/14/16 [id number]   First Provider Initiated Contact with Patient 08/14/16 0910      Chief Complaint  Patient presents with  . Abdominal Cramping  . Back Pain  . Nasal Congestion   25 yo G3P2 at 4445w4d presenting with URI symptoms, back pain, and abdominal cramping and spotting. Pt states she has had a cold for a week, with runny nose, cough, congestion, sore throat, and subjective chills but no measured fevers. Has not taken any medications. Has had N/V for a few weeks, believes it is morning sickness.  Back pain started 3 days ago when she was sitting up in bed. Characterizes as crampy, achy pain that improves with movement and massage. Denies radiation of pain; has not taken anything for pain, and thinks the pain has gotten worse. Cannot recall any inciting incident. Denies hematuria, pyuria, or dysuria. Abdominal cramping started two days ago; she felt pain similar to period cramps, went to the bathroom and found a small amount of blood in her underwear. Noted some more spotting last night. No other bleeding. Cramping has been continuous, with no provoking or alleviating factors. Has not tried any medications. Has had some discharge which she says is normal.     OB History    Gravida Para Term Preterm AB Living   3 2 2  0 0 2   SAB TAB Ectopic Multiple Live Births   0 0 0 0 2      Past Medical History:  Diagnosis Date  . Depression   . Depression   . Mental disorder    depression  . Sickle cell trait [facility](HCC)     Past Surgical History:  Procedure Laterality Date  . NO PAST SURGERIES    . WISDOM TOOTH EXTRACTION      Family History  Problem Relation Age of Onset  . Bipolar disorder Mother   . Schizophrenia Mother   . Anesthesia problems Neg Hx   . Hypotension Neg Hx   . Malignant hyperthermia Neg Hx   . Pseudochol deficiency Neg Hx     Social History  Substance Use Topics  . Smoking status: Current Every Day Smoker     Packs/day: 0.05    Years: 6.00    Types: Cigarettes  . Smokeless tobacco: Never Used  . Alcohol use No    Allergies: No Known Allergies  Prescriptions Prior to Admission  Medication Sig Dispense Refill Last Dose  . Prenatal Vit-FePoly-FA-DHA (VITAFOL-ONE) 29-1-200 MG CAPS Take 1 capsule by mouth daily before breakfast. 30 capsule 11 08/13/2016 at Unknown time  . ibuprofen (ADVIL,MOTRIN) 800 MG tablet Take 1 tablet (800 mg total) by mouth every 8 (eight) hours as needed for pain. (Patient not taking: Reported on 07/17/2016) 30 tablet 5 Not Taking  . levonorgestrel-ethinyl estradiol (NORDETTE) 0.15-30 MG-MCG tablet Take 1 tablet by mouth daily. (Patient not taking: Reported on 08/14/2016) 1 Package 11 Not Taking at Unknown time    Review of Systems  Constitutional: Positive for chills. Negative for fever.  HENT: Positive for congestion and sore throat.   Respiratory: Positive for cough.   Gastrointestinal: Positive for abdominal pain, nausea and vomiting.  Musculoskeletal: Positive for back pain.   Physical Exam   Blood pressure 118/83, pulse 77, temperature 98.2 F (36.8 C), resp. rate 18, height 5' 5.5" (1.664 m), weight 83 kg (183 lb), last menstrual period 06/15/2016, SpO2 100 %.  Physical Exam  Constitutional: She is oriented to  person, place, and time. She appears well-developed and well-nourished. No distress.  Cardiovascular: Normal rate, regular rhythm and normal heart sounds.  Exam reveals no gallop and no friction rub.   No murmur heard. Respiratory: Effort normal and breath sounds normal. No respiratory distress. She has no wheezes. She has no rales.  GI: Soft. Bowel sounds are normal. She exhibits no distension and no mass. There is tenderness. There is no rebound and no guarding.  Musculoskeletal: Normal range of motion.  Neurological: She is alert and oriented to person, place, and time.  Psychiatric: She has a normal mood and affect.    MAU Course   Procedures  MDM Pregnancy test positive. Labs ordered and reviewed. Urinalysis normal. BV on wet prep. TVUS shows 4872w4d live IUP and subchorionic hematoma.   Assessment and Plan  1. Viral URI  -- List of meds safe in pregnancy 2. Back pain  -- Tylenol PRN, avoid bed rest 3. Subchorionic hematoma causing bleeding during pregnancy  -- Monitor for worsening bleeds 4. IUP  -- Prenatal care 5. BV  -- Flagyl gel BID for 5 days  [name] 08/14/2016, 9:17 AM

## 2016-08-14 NOTE — Discharge Instructions (Signed)
Bacterial Vaginosis Bacterial vaginosis is an infection of the vagina. It happens when too many germs (bacteria) grow in the vagina. Having this infection puts you at risk for getting other infections from sex. Treating this infection can help lower your risk for other infections, such as:   Chlamydia.  Gonorrhea.  HIV.  Herpes. HOME CARE  Take your medicine as told by your doctor.  Finish your medicine even if you start to feel better.  Tell your sex partner that you have an infection. They should see their doctor for treatment.  During treatment:  Avoid sex or use condoms correctly.  Do not douche.  Do not drink alcohol unless your doctor tells you it is ok.  Do not breastfeed unless your doctor tells you it is ok. GET HELP IF:  You are not getting better after 3 days of treatment.  You have more grey fluid (discharge) coming from your vagina than before.  You have more pain than before.  You have a fever. MAKE SURE YOU:   Understand these instructions.  Will watch your condition.  Will get help right away if you are not doing well or get worse.   This information is not intended to replace advice given to you by your health care provider. Make sure you discuss any questions you have with your health care provider.   Document Released: 07/07/2008 Document Revised: 10/19/2014 Document Reviewed: 05/10/2013 Elsevier Interactive Patient Education 2016 [employer].   First Trimester of Pregnancy The first trimester of pregnancy is from week 1 until the end of week 12 (months 1 through 3). During this time, your baby will begin to develop inside you. At 6-8 weeks, the eyes and face are formed, and the heartbeat can be seen on ultrasound. At the end of 12 weeks, all the baby's organs are formed. Prenatal care is all the medical care you receive before the birth of your baby. Make sure you get good prenatal care and follow all of your doctor's instructions. HOME CARE    Medicines  Take medicine only as told by your doctor. Some medicines are safe and some are not during pregnancy.  Take your prenatal vitamins as told by your doctor.  Take medicine that helps you poop (stool softener) as needed if your doctor says it is okay. Diet  Eat regular, healthy meals.  Your doctor will tell you the amount of weight gain that is right for you.  Avoid raw meat and uncooked cheese.  If you feel sick to your stomach (nauseous) or throw up (vomit):  Eat 4 or 5 small meals a day instead of 3 large meals.  Try eating a few soda crackers.  Drink liquids between meals instead of during meals.  If you have a hard time pooping (constipation):  Eat high-fiber foods like fresh vegetables, fruit, and whole grains.  Drink enough fluids to keep your pee (urine) clear or pale yellow. Activity and Exercise  Exercise only as told by your doctor. Stop exercising if you have cramps or pain in your lower belly (abdomen) or low back.  Try to avoid standing for long periods of time. Move your legs often if you must stand in one place for a long time.  Avoid heavy lifting.  Wear low-heeled shoes. Sit and stand up straight.  You can have sex unless your doctor tells you not to. Relief of Pain or Discomfort  Wear a good support bra if your breasts are sore.  Take warm water baths (  sitz baths) to soothe pain or discomfort caused by hemorrhoids. Use hemorrhoid cream if your doctor says it is okay.  Rest with your legs raised if you have leg cramps or low back pain.  Wear support hose if you have puffy, bulging veins (varicose veins) in your legs. Raise (elevate) your feet for 15 minutes, 3-4 times a day. Limit salt in your diet. Prenatal Care  Schedule your prenatal visits by the twelfth week of pregnancy.  Write down your questions. Take them to your prenatal visits.  Keep all your prenatal visits as told by your doctor. Safety  Wear your seat belt at all times  when driving.  Make a list of emergency phone numbers. The list should include numbers for family, friends, the hospital, and police and fire departments. General Tips  Ask your doctor for a referral to a local prenatal class. Begin classes no later than at the start of month 6 of your pregnancy.  Ask for help if you need counseling or help with nutrition. Your doctor can give you advice or tell you where to go for help.  Do not use hot tubs, steam rooms, or saunas.  Do not douche or use tampons or scented sanitary pads.  Do not cross your legs for long periods of time.  Avoid litter boxes and soil used by cats.  Avoid all smoking, herbs, and alcohol. Avoid drugs not approved by your doctor.  Do not use any tobacco products, including cigarettes, chewing tobacco, and electronic cigarettes. If you need help quitting, ask your doctor. You may get counseling or other support to help you quit.  Visit your dentist. At home, brush your teeth with a soft toothbrush. Be gentle when you floss. GET HELP IF:  You are dizzy.  You have mild cramps or pressure in your lower belly.  You have a nagging pain in your belly area.  You continue to feel sick to your stomach, throw up, or have watery poop (diarrhea).  You have a bad smelling fluid coming from your vagina.  You have pain with peeing (urination).  You have increased puffiness (swelling) in your face, hands, legs, or ankles. GET HELP RIGHT AWAY IF:   You have a fever.  You are leaking fluid from your vagina.  You have spotting or bleeding from your vagina.  You have very bad belly cramping or pain.  You gain or lose weight rapidly.  You throw up blood. It may look like coffee grounds.  You are around people who have [place] measles, fifth disease, or chickenpox.  You have a very bad headache.  You have shortness of breath.  You have any kind of trauma, such as from a fall or a car accident.   This information is not  intended to replace advice given to you by your health care provider. Make sure you discuss any questions you have with your health care provider.   Document Released: 03/16/2008 Document Revised: 10/19/2014 Document Reviewed: 08/08/2013 Elsevier Interactive Patient Education 2016 [employer]vier Inc[employer]  SAFE MEDICATIONS IN PREGNANCY  Acne:  Benzoyl Peroxide  Salicylic Acid   Backache/Headache:  Tylenol: 2 regular strength every 4 hours OR        2 Extra strength every 6 hours   Colds/Coughs/Allergies:  Benadryl (alcohol free) 25 mg every 6 hours as needed  Breath right strips  Claritin  Cepacol throat lozenges  Chloraseptic throat spray  Cold-Eeze- up to three times per day  Cough drops, alcohol free  Flonase (by  prescription only)  Guaifenesin  Mucinex  Robitussin DM (plain only, alcohol free)  Saline nasal spray/drops  Sudafed (pseudoephedrine) & Actifed * use only after [redacted] weeks gestation and if you do not have high blood pressure  Tylenol  Vicks Vaporub  Zinc lozenges  Zyrtec   Constipation:  Colace  Ducolax suppositories  Fleet enema  Glycerin suppositories  Metamucil  Milk of magnesia  Miralax  Senokot  Smooth move tea   Diarrhea:  Kaopectate  Imodium A-D   *NO pepto Bismol   Hemorrhoids:  Anusol  Anusol HC  Preparation H  Tucks   Indigestion:  Tums  Maalox  Mylanta  Zantac  Pepcid   Insomnia:  Benadryl (alcohol free) 25mg  every 6 hours as needed  Tylenol PM  Unisom, no Gelcaps   Leg Cramps:  Tums  MagGel   Nausea/Vomiting:  Bonine  Dramamine  Emetrol  Ginger extract  Sea bands  Meclizine  Nausea medication to take during pregnancy:  Unisom (doxylamine succinate 25 mg tablets) Take one tablet daily at bedtime. If symptoms are not adequately controlled, the dose can be increased to a maximum recommended dose of two tablets daily (1/2 tablet in the morning, 1/2 tablet mid-afternoon and one at bedtime).  Vitamin B6 100mg  tablets.  Take one tablet twice a day (up to 200 mg per day).   Skin Rashes:  Aveeno products  Benadryl cream or 25mg  every 6 hours as needed  Calamine Lotion  1% cortisone cream   Yeast infection:  Gyne-lotrimin 7  Monistat 7    **If taking multiple medications, please check labels to avoid duplicating the same active ingredients  **take medication as directed on the label  ** Do not exceed 4000 mg of tylenol in 24 hours  **Do not take medications that contain aspirin or ibuprofen

## 2016-08-17 LAB — GC/CHLAMYDIA PROBE AMP (~~LOC~~) NOT AT ARMC
Chlamydia: NEGATIVE
Neisseria Gonorrhea: NEGATIVE

## 2016-08-18 ENCOUNTER — Telehealth: Payer: Self-pay | Admitting: *Deleted

## 2016-08-18 ENCOUNTER — Other Ambulatory Visit (HOSPITAL_COMMUNITY)
Admission: RE | Admit: 2016-08-18 | Discharge: 2016-08-18 | Disposition: A | Discharge status: Home / Self Care | Payer: Medicaid Other | Source: Ambulatory Visit | Attending: Obstetrics | Admitting: Obstetrics

## 2016-08-18 ENCOUNTER — Ambulatory Visit (INDEPENDENT_AMBULATORY_CARE_PROVIDER_SITE_OTHER): Payer: Medicaid Other | Admitting: Certified Nurse Midwife

## 2016-08-18 ENCOUNTER — Encounter: Payer: Self-pay | Admitting: Certified Nurse Midwife

## 2016-08-18 ENCOUNTER — Encounter: Payer: Self-pay | Admitting: Obstetrics

## 2016-08-18 VITALS — BP 97/67 | HR 74 | Wt 182.0 lb

## 2016-08-18 DIAGNOSIS — R197 Diarrhea, unspecified: Secondary | ICD-10-CM | POA: Diagnosis not present

## 2016-08-18 DIAGNOSIS — Z3481 Encounter for supervision of other normal pregnancy, first trimester: Secondary | ICD-10-CM | POA: Diagnosis not present

## 2016-08-18 DIAGNOSIS — O219 Vomiting of pregnancy, unspecified: Secondary | ICD-10-CM | POA: Diagnosis not present

## 2016-08-18 DIAGNOSIS — Z01419 Encounter for gynecological examination (general) (routine) without abnormal findings: Secondary | ICD-10-CM | POA: Insufficient documentation

## 2016-08-18 DIAGNOSIS — Z3480 Encounter for supervision of other normal pregnancy, unspecified trimester: Secondary | ICD-10-CM | POA: Insufficient documentation

## 2016-08-18 MED ORDER — DOXYLAMINE-PYRIDOXINE 10-10 MG PO TBEC: DELAYED_RELEASE_TABLET | ORAL | 4 refills | Status: DC

## 2016-08-18 NOTE — Progress Notes (Signed)
Pt states that she was smoking marijuana but stopped once she found out she was pregnant. Pt states that she is having N&V.   Pt [facility], pt has not yet started.

## 2016-08-18 NOTE — Addendum Note (Signed)
Addended by: Marya LandryFOSTER, Laverda Stribling D on: 08/18/2016 02:12 PM   Modules accepted: Orders

## 2016-08-18 NOTE — Progress Notes (Addendum)
Subjective:    [name] is being seen today for her first obstetrical visit.  This is a planned pregnancy. She is at 5359w1d gestation. Her obstetrical history is significant for smoker and quit smoking at start of pregnancy. Relationship with FOB: significant other, living together. Patient does intend to breast feed. Pregnancy history fully reviewed.  Hx of cutting last cutting 4 years ago.  Denies any problems with depression.  Reports diarrhea for 1 week, denies any viral illness or use of antibiotics with the exception of metrogel vaginally.    The information documented in the HPI was reviewed and verified.  Menstrual History: OB History    Gravida Para Term Preterm AB Living   3 2 2  0 0 2   SAB TAB Ectopic Multiple Live Births   0 0 0 0 2     Proven to 6#7oz.  Vaginal deliveries.    Menarche age: 25 years of age.   Patient's last menstrual period was 06/15/2016 (approximate).    Past Medical History:  Diagnosis Date  . Depression   . Depression   . Mental disorder    depression  . Sickle cell trait [facility](HCC)     Past Surgical History:  Procedure Laterality Date  . NO PAST SURGERIES    . WISDOM TOOTH EXTRACTION       (Not in a hospital admission) No Known Allergies  Social History  Substance Use Topics  . Smoking status: Former Smoker    Packs/day: 0.05    Years: 6.00    Types: Cigarettes  . Smokeless tobacco: Never Used     Comment: stopped at beginning of pregnancy  . Alcohol use No    Family History  Problem Relation Age of Onset  . Bipolar disorder Mother   . Schizophrenia Mother   . Anesthesia problems Neg Hx   . Hypotension Neg Hx   . Malignant hyperthermia Neg Hx   . Pseudochol deficiency Neg Hx      Review of Systems Constitutional: negative for weight loss Gastrointestinal: + for nausea & vomiting at least 2X/day Genitourinary:negative for genital lesions and vaginal discharge and dysuria Musculoskeletal:negative for back  pain Behavioral/Psych: negative for abusive relationship, depression, illegal drug usage and tobacco use    Objective:    BP 97/67   Pulse 74   Wt 182 lb (82.6 kg)   LMP 06/15/2016 (Approximate)   BMI 29.83 kg/m  General Appearance:    Alert, cooperative, no distress, appears stated age  Head:    Normocephalic, without obvious abnormality, atraumatic  Eyes:    PERRL, conjunctiva/corneas clear, EOM's intact, fundi    benign, both eyes  Ears:    Normal TM's and external ear canals, both ears  Nose:   Nares normal, septum midline, mucosa normal, no drainage    or sinus tenderness  Throat:   Lips, mucosa, and tongue normal; teeth and gums normal  Neck:   Supple, symmetrical, trachea midline, no adenopathy;    thyroid:  no enlargement/tenderness/nodules; no carotid   bruit or JVD  Back:     Symmetric, no curvature, ROM normal, no CVA tenderness  Lungs:     Clear to auscultation bilaterally, respirations unlabored  Chest Wall:    No tenderness or deformity   Heart:    Regular rate and rhythm, S1 and S2 normal, no murmur, rub   or gallop  Breast Exam:    No tenderness, masses, or nipple abnormality  Abdomen:     Soft, non-tender, bowel  sounds active all four quadrants,    no masses, no organomegaly  Genitalia:    Normal female without lesion, discharge or tenderness  Extremities:   Extremities normal, atraumatic, no cyanosis or edema  Pulses:   2+ and symmetric all extremities  Skin:   Skin color, texture, turgor normal, no rashes or lesions  Lymph nodes:   Cervical, supraclavicular, and axillary nodes normal  Neurologic:   CNII-XII intact, normal strength, sensation and reflexes    throughout        Cervix:     Long, thick, closed and posterior.  FHR: seen with bedside US.     Lab Review Urine pregnancy test Labs reviewed yes Radiologic studies reviewed yes Assessment:    Pregnancy at [redacted]w[redacted]d weeks   N&V in early pregnancy  Diarrhea  Plan:      Prenatal vitamins.   Counseling provided regarding continued use of seat belts, cessation of alcohol consumption, smoking or use of illicit drugs; infection precautions i.e., influenza/TDAP immunizations, toxoplasmosis,CMV, parvovirus, listeria and varicella; workplace safety, exercise during pregnancy; routine dental care, safe medications, sexual activity, hot tubs, saunas, pools, travel, caffeine use, fish and methlymercury, potential toxins, hair treatments, varicose veins Weight gain recommendations per IOM guidelines reviewed: underweight/BMI< 18.5--> gain 28 - 40 lbs; normal weight/BMI 18.5 - 24.9--> gain 25 - 35 lbs; overweight/BMI 25 - 29.9--> gain 15 - 25 lbs; obese/BMI >30->gain  11 - 20 lbs Problem list reviewed and updated. FIRST/CF mutation testing/NIPT/QUAD SCREEN/fragile X/Ashkenazi Jewish population testing/Spinal muscular atrophy discussed: ordered. Role of ultrasound in pregnancy discussed; fetal survey: requested. Amniocentesis discussed: not indicated. VBAC calculator score: VBAC consent form provided Meds ordered this encounter  Medications  . Doxylamine-Pyridoxine (DICLEGIS) 10-10 MG TBEC    Sig: Take 1 tablet with breakfast and lunch.  Take 2 tablets at bedtime.    Dispense:  100 tablet    Refill:  4   Orders Placed This Encounter  Procedures  . Culture, OB Urine  . Stool Culture  . HIV antibody  . VITAMIN D 25 Hydroxy (Vit-D Deficiency, Fractures)  . Prenatal Profile I  . TSH  . Hemoglobinopathy evaluation  . Varicella zoster antibody, IgG  . Hemoglobin A1c  . ToxASSURE Select 13 (MW), Urine  . MaterniT21 PLUS Core+SCA    Order Specific Question:   Is the patient insulin dependent?    Answer:   No    Order Specific Question:   Please enter gestational age. This should be expressed as weeks AND days, i.e. 16w 6d. Enter weeks here. Enter days in next question.    Answer:   25    Order Specific Question:   Please enter gestational age. This should be expressed as weeks AND days,  i.e. 16w 6d. Enter days here. Enter weeks in previous question.    Answer:   1    Order Specific Question:   How was gestational age calculated?    Answer:   LMP    Order Specific Question:   Please give the date of LMP OR Ultrasound OR Estimated date of delivery.    Answer:   03/22/2017    Order Specific Question:   Number of Fetuses (Type of Pregnancy):    Answer:   1    Order Specific Question:   Indications for performing the test? (please choose all that apply):    Answer:   Routine screening    Order Specific Question:   If this is a repeat specimen, please indicate the reason:  Answer:   Not indicated    Order Specific Question:   Please specify the patient's race: (C=White/Caucasion, B=Black, I=Native American, A=Asian, H=Hispanic, O=Other, U=Unknown)    Answer:   B    Order Specific Question:   Donor Egg - indicate if the egg was obtained from in vitro fertilization.    Answer:   N    Order Specific Question:   Age of Egg Donor.    Answer:   89    Order Specific Question:   Prior Down Syndrome/ONTD screening during current pregnancy.    Answer:   N    Order Specific Question:   Prior First Trimester Testing    Answer:   N    Order Specific Question:   Prior Second Trimester Testing    Answer:   N    Order Specific Question:   Family History of Neural Tube Defects    Answer:   N    Order Specific Question:   Prior Pregnancy with Down Syndrome    Answer:   N    Order Specific Question:   Please give the patient's weight (in pounds)    Answer:   182  . Cystic Fibrosis Mutation 97    Follow up in 4 weeks. 50% of 30 min visit spent on counseling and coordination of care.

## 2016-08-18 NOTE — Telephone Encounter (Signed)
Pt had stool culture ordered at time of visit. Pt did not receive supplies for collection at visit today.  Attempt to contact pt to make aware.  LM on VM to call office regarding lab.

## 2016-08-20 ENCOUNTER — Encounter: Payer: Self-pay | Admitting: *Deleted

## 2016-08-20 LAB — CYTOLOGY - PAP: Diagnosis: NEGATIVE

## 2016-08-20 NOTE — Telephone Encounter (Signed)
Patient notified on voice mail and MyChart

## 2016-08-21 LAB — CULTURE, OB URINE

## 2016-08-21 LAB — URINE CULTURE, OB REFLEX

## 2016-08-25 ENCOUNTER — Other Ambulatory Visit: Payer: Self-pay | Admitting: Certified Nurse Midwife

## 2016-08-25 DIAGNOSIS — Z3481 Encounter for supervision of other normal pregnancy, first trimester: Secondary | ICD-10-CM

## 2016-08-25 DIAGNOSIS — R7989 Other specified abnormal findings of blood chemistry: Secondary | ICD-10-CM

## 2016-08-25 LAB — HEMOGLOBINOPATHY EVALUATION
HEMOGLOBIN A2 QUANTITATION: 2.6 % (ref 0.7–3.1)
HEMOGLOBIN F QUANTITATION: 0 % (ref 0.0–2.0)
HGB C: 0 %
HGB S: 0 %
Hgb A: 97.4 % (ref 94.0–98.0)

## 2016-08-25 LAB — PRENATAL PROFILE I(LABCORP)
Antibody Screen: NEGATIVE
BASOS ABS: 0 10*3/uL (ref 0.0–0.2)
Basos: 0 %
EOS (ABSOLUTE): 0.1 10*3/uL (ref 0.0–0.4)
EOS: 1 %
HEMATOCRIT: 38.9 % (ref 34.0–46.6)
Hemoglobin: 13.4 g/dL (ref 11.1–15.9)
Hepatitis B Surface Ag: NEGATIVE
Immature Grans (Abs): 0 10*3/uL (ref 0.0–0.1)
Immature Granulocytes: 0 %
LYMPHS ABS: 3 10*3/uL (ref 0.7–3.1)
Lymphs: 33 %
MCH: 30.7 pg (ref 26.6–33.0)
MCHC: 34.4 g/dL (ref 31.5–35.7)
MCV: 89 fL (ref 79–97)
Monocytes Absolute: 0.9 10*3/uL (ref 0.1–0.9)
Monocytes: 9 %
Neutrophils Absolute: 5.2 10*3/uL (ref 1.4–7.0)
Neutrophils: 57 %
PLATELETS: 269 10*3/uL (ref 150–379)
RBC: 4.36 x10E6/uL (ref 3.77–5.28)
RDW: 13.6 % (ref 12.3–15.4)
RPR Ser Ql: NONREACTIVE
Rh Factor: POSITIVE
Rubella Antibodies, IGG: 1.52 index (ref >0.99)
WBC: 9.1 10*3/uL (ref 3.4–10.8)

## 2016-08-25 LAB — MATERNIT21 PLUS CORE+SCA
CHROMOSOME 13: NEGATIVE
CHROMOSOME 18: NEGATIVE
CHROMOSOME 21: NEGATIVE
Y Chromosome: NOT DETECTED

## 2016-08-25 LAB — HEMOGLOBIN A1C
Est. average glucose Bld gHb Est-mCnc: 105 mg/dL
HEMOGLOBIN A1C: 5.3 % (ref 4.8–5.6)

## 2016-08-25 LAB — CYSTIC FIBROSIS MUTATION 97: GENE DIS ANAL CARRIER INTERP BLD/T-IMP: NOT DETECTED

## 2016-08-25 LAB — VARICELLA ZOSTER ANTIBODY, IGG: VARICELLA: 548 {index} (ref >165)

## 2016-08-25 LAB — TOXASSURE SELECT 13 (MW), URINE

## 2016-08-25 LAB — VITAMIN D 25 HYDROXY (VIT D DEFICIENCY, FRACTURES): Vit D, 25-Hydroxy: 20.3 ng/mL — ABNORMAL LOW (ref 30.0–100.0)

## 2016-08-25 LAB — TSH: TSH: 0.379 u[IU]/mL — ABNORMAL LOW (ref 0.450–4.500)

## 2016-08-25 LAB — HIV ANTIBODY (ROUTINE TESTING W REFLEX): HIV Screen 4th Generation wRfx: NONREACTIVE

## 2016-08-26 ENCOUNTER — Encounter: Payer: Medicaid Other | Admitting: Certified Nurse Midwife

## 2016-09-15 ENCOUNTER — Encounter: Payer: Self-pay | Admitting: Obstetrics

## 2016-09-15 ENCOUNTER — Ambulatory Visit (INDEPENDENT_AMBULATORY_CARE_PROVIDER_SITE_OTHER): Payer: Medicaid Other | Admitting: Obstetrics

## 2016-09-15 VITALS — BP 119/75 | HR 81 | Temp 98.3°F | Wt 185.3 lb

## 2016-09-15 DIAGNOSIS — Z3482 Encounter for supervision of other normal pregnancy, second trimester: Secondary | ICD-10-CM

## 2016-09-15 NOTE — Progress Notes (Signed)
Patient c/o frequent falls

## 2016-09-15 NOTE — Progress Notes (Signed)
  Subjective:    Jeanette GhaziRochelle D Acord is a 25 y.o. female being seen today for her obstetrical visit. She is at 4837w1d gestation. Patient reports: no complaints.  Problem List Items Addressed This Visit    Encounter for supervision of other normal pregnancy - Primary   Relevant Orders   AFP, Quad Screen     Patient Active Problem List   Diagnosis Date Noted  . Encounter for supervision of other normal pregnancy 08/18/2016  . 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 Tobacco dependence 07/01/2015  . Pain aggravated by physical activity 03/07/2013  . Backache 03/07/2013  . Low backache 02/07/2013  . Deliberate self-cutting 10/12/2011    Objective:     BP 119/75   Pulse 81   Temp 98.3 F (36.8 C)   Wt 185 lb 4.8 oz (84.1 kg)   LMP 06/15/2016 (Approximate)   BMI 30.37 kg/m  Uterine Size: Below umbilicus     Assessment:    Pregnancy @ 2837w1d  weeks Doing well    Plan:    Problem list reviewed and updated. Labs reviewed.  Follow up in 4 weeks. FIRST/CF mutation testing/NIPT/QUAD SCREEN/fragile X/Ashkenazi Jewish population testing/Spinal muscular atrophy discussed: requested. Role of ultrasound in pregnancy discussed; fetal survey: requested. Amniocentesis discussed: not indicated. 50% of 15 minute visit spent on counseling and coordination of care.

## 2016-09-30 ENCOUNTER — Ambulatory Visit (INDEPENDENT_AMBULATORY_CARE_PROVIDER_SITE_OTHER): Payer: Medicaid Other | Admitting: Obstetrics

## 2016-09-30 ENCOUNTER — Other Ambulatory Visit: Payer: Self-pay | Admitting: *Deleted

## 2016-09-30 ENCOUNTER — Encounter: Payer: Self-pay | Admitting: Obstetrics

## 2016-09-30 VITALS — BP 106/62 | HR 92 | Wt 183.0 lb

## 2016-09-30 DIAGNOSIS — Z3482 Encounter for supervision of other normal pregnancy, second trimester: Secondary | ICD-10-CM

## 2016-09-30 DIAGNOSIS — Z3480 Encounter for supervision of other normal pregnancy, unspecified trimester: Secondary | ICD-10-CM

## 2016-09-30 NOTE — Progress Notes (Signed)
  Subjective:    [name] is a 25 y.o. female being seen today for her obstetrical visit. She is at 6945w2d gestation. Patient reports: no complaints.  Problem List Items Addressed This Visit    Encounter for supervision of other normal pregnancy - Primary   Relevant Orders   AFP, Quad Screen     Patient Active Problem List   Diagnosis Date Noted  . Encounter for supervision of other normal pregnancy 08/18/2016  . 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 Tobacco dependence 07/01/2015  . Pain aggravated by physical activity 03/07/2013  . Backache 03/07/2013  . Low backache 02/07/2013  . Deliberate self-cutting 10/12/2011    Objective:     BP 106/62   Pulse 92   Wt 183 lb (83 kg)   LMP 06/15/2016 (Approximate)   BMI 29.99 kg/m  Uterine Size: Below umbilicus     Assessment:    Pregnancy @ 4445w2d  weeks Doing well    Plan:    Problem list reviewed and updated. Labs reviewed.  Follow up in 4 weeks. FIRST/CF mutation testing/NIPT/QUAD SCREEN/fragile X/Ashkenazi Jewish population testing/Spinal muscular atrophy discussed: requested. Role of ultrasound in pregnancy discussed; fetal survey: requested. Amniocentesis discussed: not indicated. 50% of 15 minute visit spent on counseling and coordination of care.  Patient ID: [name], female   DOB: 05/10/1991, 25 y.o.   MRN: [id number]

## 2016-10-02 LAB — AFP, QUAD SCREEN
DIA MOM VALUE: 1.34
DIA Value (EIA): 224.44 pg/mL
DSR (BY AGE) 1 IN: 1012
DSR (Second Trimester) 1 IN: 8118
GESTATIONAL AGE AFP: 15.3 wk
MATERNAL AGE AT EDD: 25.4 a
MSAFP Mom: 1.91
MSAFP: 54 ng/mL
MSHCG MOM: 0.77
MSHCG: 32527 m[IU]/mL
OSB RISK: 1975
T18 (By Age): 1:3944 {titer}
Test Results:: NEGATIVE
UE3 MOM: 0.94
UE3 VALUE: 0.58 ng/mL
Weight: 185 [lb_av]

## 2016-10-02 NOTE — Addendum Note (Signed)
Addended by: Maretta BeesMCGLASHAN, Gianfranco Araki J on: 10/02/2016 08:45 AM   Modules accepted: Orders

## 2016-10-08 ENCOUNTER — Encounter (HOSPITAL_COMMUNITY): Payer: Self-pay | Admitting: Obstetrics

## 2016-10-10 ENCOUNTER — Encounter (HOSPITAL_COMMUNITY): Payer: Self-pay | Admitting: Emergency Medicine

## 2016-10-10 ENCOUNTER — Emergency Department (HOSPITAL_COMMUNITY)
Admission: EM | Admit: 2016-10-10 | Discharge: 2016-10-10 | Disposition: A | Discharge status: Home / Self Care | Payer: Medicaid Other | Attending: Emergency Medicine | Admitting: Emergency Medicine

## 2016-10-10 DIAGNOSIS — H6691 Otitis media, unspecified, right ear: Secondary | ICD-10-CM | POA: Diagnosis not present

## 2016-10-10 DIAGNOSIS — J029 Acute pharyngitis, unspecified: Secondary | ICD-10-CM

## 2016-10-10 DIAGNOSIS — Z87891 Personal history of nicotine dependence: Secondary | ICD-10-CM | POA: Diagnosis not present

## 2016-10-10 DIAGNOSIS — H9201 Otalgia, right ear: Secondary | ICD-10-CM | POA: Diagnosis present

## 2016-10-10 DIAGNOSIS — Z79899 Other long term (current) drug therapy: Secondary | ICD-10-CM | POA: Diagnosis not present

## 2016-10-10 MED ORDER — AMOXICILLIN 500 MG PO CAPS
500.0000 mg | ORAL_CAPSULE | Freq: Two times a day (BID) | ORAL | 0 refills | Status: DC
Start: 2016-10-10 — End: 2016-11-11

## 2016-10-10 NOTE — ED Triage Notes (Signed)
Pt c/o right ear ache onset of last night, describes it like there was water in there. Pt went to her grandmothers house and put peroxide in her ear. Pt states now it feels stopped up.

## 2016-10-10 NOTE — Discharge Instructions (Signed)
Take Amoxicillin for 10 days. Take with food Take Tylenol for pain

## 2016-10-11 NOTE — ED Provider Notes (Signed)
[facility]-EM[facility]Y DEPT Provider Note   CSN: [id number] Arrival date & time: 10/10/16  1835     History   Chief Complaint Chief Complaint  Patient presents with  . Cerumen Impaction    HPI [name] is a 25 y.o. female who presents with right ear pain. She is currently [redacted] weeks pregnant. She states that over the past day she has had worsening right ear pain. The pain is now spreading the the left ear as well. Denies sick contacts or recent travel. She had her ears cleaned out with peroxide but this did not help her symptoms. She endorses a sore throat as well which has been resolving. She has been taking Ibuprofen for pain. She denies fever, chills, headache, congestion, rhinorrhea, difficulty swallowing, chest pain, cough, SOB, N/V.  HPI  Past Medical History:  Diagnosis Date  . Depression   . Depression   . Mental disorder    depression  . Sickle cell trait [facility](HCC)     Patient Active Problem List   Diagnosis Date Noted  . Encounter for supervision of other normal pregnancy 08/18/2016  . 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 Tobacco dependence 07/01/2015  . Pain aggravated by physical activity 03/07/2013  . Backache 03/07/2013  . Low backache 02/07/2013  . Deliberate self-cutting 10/12/2011    Past Surgical History:  Procedure Laterality Date  . NO PAST SURGERIES    . WISDOM TOOTH EXTRACTION      OB History    Gravida Para Term Preterm AB Living   3 2 2  0 0 2   SAB TAB Ectopic Multiple Live Births   0 0 0 0 2       Home Medications    Prior to Admission medications   Medication Sig Start Date End Date Taking? Authorizing Provider  amoxicillin (AMOXIL) 500 MG capsule Take 1 capsule (500 mg total) by mouth 2 (two) times daily. 10/10/16   Bethel BornKelly Marie Santasia Rew, PA-C  Doxylamine-Pyridoxine (DICLEGIS) 10-10 MG TBEC Take 1 tablet with breakfast and lunch.  Take 2 tablets at bedtime. 08/18/16   Roe Coombsachelle A Denney, CNM    Prenatal Vit-FePoly-FA-DHA (VITAFOL-ONE) 29-1-200 MG CAPS Take 1 capsule by mouth daily before breakfast. 01/22/16   Brock Badharles A Harper, MD    Family History Family History  Problem Relation Age of Onset  . Bipolar disorder Mother   . Schizophrenia Mother   . Anesthesia problems Neg Hx   . Hypotension Neg Hx   . Malignant hyperthermia Neg Hx   . Pseudochol deficiency Neg Hx     Social History Social History  Substance Use Topics  . Smoking status: Former Smoker    Packs/day: 0.05    Years: 6.00    Types: Cigarettes  . Smokeless tobacco: Never Used     Comment: stopped at beginning of pregnancy  . Alcohol use No     Allergies   Patient has no known allergies.   Review of Systems Review of Systems  Constitutional: Negative for chills and fever.  HENT: Positive for ear pain and sore throat (resolving). Negative for congestion, ear discharge, rhinorrhea and trouble swallowing.   Eyes: Negative for visual disturbance.  Respiratory: Negative for cough and shortness of breath.   Cardiovascular: Negative for chest pain.  Gastrointestinal: Negative for abdominal pain, nausea and vomiting.     Physical Exam Updated Vital Signs BP 121/65 (BP Location: Left Arm)   Pulse 84   Temp 98.4 F (36.9  C) (Oral)   Resp 14   Ht 5' 5.5" (1.664 m)   Wt 81.8 kg   LMP 06/15/2016 (Approximate)   SpO2 98%   BMI 29.57 kg/m   Physical Exam  Constitutional: She is oriented to person, place, and time. She appears well-developed and well-nourished. No distress.  HENT:  Head: Normocephalic and atraumatic.  Right Ear: Hearing, external ear and ear canal normal. No foreign bodies. No mastoid tenderness. Tympanic membrane is injected, erythematous and bulging. Tympanic membrane is not perforated. A middle ear effusion is present. No hemotympanum.  Left Ear: Hearing, tympanic membrane, external ear and ear canal normal.  Nose: Nose normal. Right sinus exhibits no maxillary sinus tenderness and  no frontal sinus tenderness. Left sinus exhibits no maxillary sinus tenderness and no frontal sinus tenderness.  Mouth/Throat: Uvula is midline and mucous membranes are normal. Mucous membranes are not pale. No oropharyngeal exudate, posterior oropharyngeal edema, posterior oropharyngeal erythema or tonsillar abscesses. Tonsils are 2+ on the right. Tonsils are 2+ on the left. Tonsillar exudate (left sided exudate).  Eyes: Conjunctivae are normal. Pupils are equal, round, and reactive to light. Right eye exhibits no discharge. Left eye exhibits no discharge. No scleral icterus.  Neck: Normal range of motion.  Cardiovascular: Normal rate.   Pulmonary/Chest: Effort normal. No respiratory distress.  Abdominal: She exhibits no distension.  Neurological: She is alert and oriented to person, place, and time.  Skin: Skin is warm and dry.  Psychiatric: She has a normal mood and affect. Her behavior is normal.  Nursing note and vitals reviewed.    ED Treatments / Results  Labs (all labs ordered are listed, but only abnormal results are displayed) Labs Reviewed - No data to display  EKG  EKG Interpretation None       Radiology No results found.  Procedures Procedures (including critical care time)  Medications Ordered in ED Medications - No data to display   Initial Impression / Assessment and Plan / ED Course  I have reviewed the triage vital signs and the nursing notes.  Pertinent labs & imaging results that were available during my care of the patient were reviewed by me and considered in my medical decision making (see chart for details).  Clinical Course    25 year old female with acute right otitis media and pharyngitis/tonsillitis. Patient is afebrile, not tachycardic or tachypneic, normotensive, and not hypoxic. Her TM is bulging and injected. She also has tonsillar exudates. Will d/c with Amoxicillin and advised to d/c Ibuprofen while pregnant and to take Tylenol for pain.  Patient is NAD, non-toxic, with stable VS. Patient is informed of clinical course, understands medical decision making process, and agrees with plan. Opportunity for questions provided and all questions answered. Return precautions given.   Final Clinical Impressions(s) / ED Diagnoses   Final diagnoses:  Right otitis media, unspecified otitis media type  Pharyngitis, unspecified etiology    New Prescriptions Discharge Medication List as of 10/10/2016  8:43 PM    START taking these medications   Details  amoxicillin (AMOXIL) 500 MG capsule Take 1 capsule (500 mg total) by mouth 2 (two) times daily., Starting Sat 10/10/2016, Print         Bethel BornKelly Marie Josphine Laffey, PA-C 10/11/16 1615    Charlynne Panderavid Hsienta Yao, MD 10/12/16 [phone]

## 2016-10-12 NOTE — L&D Delivery Note (Signed)
Delivery Note At 7:03 AM a viable female was delivered via Vaginal, Spontaneous Delivery (Presentation: verte ; OA  ).  APGAR: 9, 9; weight  pending .   Placenta status:delivered intact with gentle traction.  Cord: 3 vessel  with the following complications: none.  Cord pH: not collected  Anesthesia: epdural Episiotomy: None Lacerations: Labial Est. Blood Loss (mL): 200  Mom to postpartum.  Baby to Couplet care / Skin to Skin.  Ernestina Pennaicholas Jospeh Mangel 03/09/2017, 7:33 AM

## 2016-10-15 ENCOUNTER — Encounter (HOSPITAL_COMMUNITY): Payer: Self-pay | Admitting: *Deleted

## 2016-10-15 ENCOUNTER — Emergency Department (HOSPITAL_COMMUNITY)
Admission: EM | Admit: 2016-10-15 | Discharge: 2016-10-15 | Disposition: A | Discharge status: Home / Self Care | Payer: Medicaid Other | Attending: Emergency Medicine | Admitting: Emergency Medicine

## 2016-10-15 DIAGNOSIS — H9201 Otalgia, right ear: Secondary | ICD-10-CM | POA: Insufficient documentation

## 2016-10-15 DIAGNOSIS — Z3A17 17 weeks gestation of pregnancy: Secondary | ICD-10-CM | POA: Diagnosis not present

## 2016-10-15 DIAGNOSIS — Z87891 Personal history of nicotine dependence: Secondary | ICD-10-CM | POA: Insufficient documentation

## 2016-10-15 DIAGNOSIS — O9989 Other specified diseases and conditions complicating pregnancy, childbirth and the puerperium: Secondary | ICD-10-CM | POA: Diagnosis present

## 2016-10-15 NOTE — Discharge Instructions (Signed)
Treatment: Continue taking amoxicillin and finish all the pills. Take Tylenol as prescribed over-the-counter.  Follow-up: Please follow-up with Dr. Jenne PaneBates, and ear nose and throat doctor, for further evaluation and treatment of your symptoms. Please return to emergency department if you develop any new or worsening symptoms.

## 2016-10-15 NOTE — ED Triage Notes (Signed)
Pt complains of right ear pain for the past 5 days. [facility]. Pt states her pain has not improved.

## 2016-10-15 NOTE — ED Provider Notes (Signed)
Complains of right ear pain for approximately one week with diminished hearing in her right ear. No fever. Treated with Tylenol and amoxicillin with partial relief. Patient is per currently pregnant. On exam alert nontoxic HEENT exam no facial asymmetry. Right ear no pain on retraction of tragus or pinna. She has pinkish exudate behind rightTM. ENT referral suggested   Doug SouSam Brandalynn Ofallon, MD 10/15/16 2203

## 2016-10-21 ENCOUNTER — Encounter: Payer: Self-pay | Admitting: Certified Nurse Midwife

## 2016-10-22 NOTE — ED Provider Notes (Signed)
[facility] Provider Note   CSN: [id number] Arrival date & time: 10/15/16  2106     History   Chief Complaint Chief Complaint  Patient presents with  . Otalgia    HPI [name] is a 26 y.o. female who presents with right ear pain 1 week. Patient was seen 5 days ago and given amoxicillin without complete relief. She has also been taking Tylenol. Patient is [redacted] weeks pregnant. She reports associated decreased hearing. Patient has had also an associated mild sore throat. She denies any fever, nasal congestion, difficulty swallowing, chest pain, shortness of breath, nausea, vomiting.  HPI  Past Medical History:  Diagnosis Date  . Depression   . Depression   . Mental disorder    depression  . Sickle cell trait [facility](HCC)     Patient Active Problem List   Diagnosis Date Noted  . Encounter for supervision of other normal pregnancy 08/18/2016  . 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 Tobacco dependence 07/01/2015  . Pain aggravated by physical activity 03/07/2013  . Backache 03/07/2013  . Low backache 02/07/2013  . Deliberate self-cutting 10/12/2011    Past Surgical History:  Procedure Laterality Date  . NO PAST SURGERIES    . WISDOM TOOTH EXTRACTION      OB History    Gravida Para Term Preterm AB Living   3 2 2  0 0 2   SAB TAB Ectopic Multiple Live Births   0 0 0 0 2       Home Medications    Prior to Admission medications   Medication Sig Start Date End Date Taking? Authorizing Provider  amoxicillin (AMOXIL) 500 MG capsule Take 1 capsule (500 mg total) by mouth 2 (two) times daily. 10/10/16   Bethel BornKelly Marie Gekas, PA-C  Doxylamine-Pyridoxine (DICLEGIS) 10-10 MG TBEC Take 1 tablet with breakfast and lunch.  Take 2 tablets at bedtime. 08/18/16   Roe Coombsachelle A Denney, CNM  Prenatal Vit-FePoly-FA-DHA (VITAFOL-ONE) 29-1-200 MG CAPS Take 1 capsule by mouth daily before breakfast. 01/22/16   Brock Badharles A Harper, MD     Family History Family History  Problem Relation Age of Onset  . Bipolar disorder Mother   . Schizophrenia Mother   . Anesthesia problems Neg Hx   . Hypotension Neg Hx   . Malignant hyperthermia Neg Hx   . Pseudochol deficiency Neg Hx     Social History Social History  Substance Use Topics  . Smoking status: Former Smoker    Packs/day: 0.05    Years: 6.00    Types: Cigarettes  . Smokeless tobacco: Never Used     Comment: stopped at beginning of pregnancy  . Alcohol use No     Allergies   Patient has no known allergies.   Review of Systems Review of Systems  Constitutional: Negative for chills and fever.  HENT: Positive for ear pain. Negative for congestion, facial swelling and sore throat.   Respiratory: Negative for shortness of breath.   Cardiovascular: Negative for chest pain.  Gastrointestinal: Negative for abdominal pain, nausea and vomiting.  Skin: Negative for rash and wound.  Neurological: Negative for headaches.  Psychiatric/Behavioral: The patient is not nervous/anxious.      Physical Exam Updated Vital Signs BP 103/66 (BP Location: Right Arm)   Pulse 83   Temp 98.3 F (36.8 C) (Oral)   Resp 18   LMP 06/15/2016 (Approximate)   SpO2 99%   Physical Exam  Constitutional: She appears well-developed and  well-nourished. No distress.  HENT:  Head: Normocephalic and atraumatic.  Left Ear: Tympanic membrane normal.  Ears:  Mouth/Throat: Oropharynx is clear and moist. No oropharyngeal exudate.  Eyes: Conjunctivae are normal. Pupils are equal, round, and reactive to light. Right eye exhibits no discharge. Left eye exhibits no discharge. No scleral icterus.  Neck: Normal range of motion. Neck supple. No thyromegaly present.  Cardiovascular: Normal rate, regular rhythm, normal heart sounds and intact distal pulses.  Exam reveals no gallop and no friction rub.   No murmur heard. Pulmonary/Chest: Effort normal and breath sounds normal. No stridor. No  respiratory distress. She has no wheezes. She has no rales.  Abdominal: Soft. Bowel sounds are normal. She exhibits no distension. There is no tenderness. There is no rebound and no guarding.  Musculoskeletal: She exhibits no edema.  Lymphadenopathy:    She has no cervical adenopathy.  Neurological: She is alert. Coordination normal.  Skin: Skin is warm and dry. No rash noted. She is not diaphoretic. No pallor.  Psychiatric: She has a normal mood and affect.  Nursing note and vitals reviewed.    ED Treatments / Results  Labs (all labs ordered are listed, but only abnormal results are displayed) Labs Reviewed - No data to display  EKG  EKG Interpretation None       Radiology No results found.  Procedures Procedures (including critical care time)  Medications Ordered in ED Medications - No data to display   Initial Impression / Assessment and Plan / ED Course  I have reviewed the triage vital signs and the nursing notes.  Pertinent labs & imaging results that were available during my care of the patient were reviewed by me and considered in my medical decision making (see chart for details).  Clinical Course     Doubt infectious process at this point. Due to unknown exudate behind the TM, we will refer to ENT. Patient advised to finish amoxicillin. Patient advised to take Tylenol or ibuprofen has tried over-the-counter for her pain. Return precautions discussed. Patient understands and agrees with plan. Patient vitals stable throughout ED course and discharged in satisfactory condition. Patient also evaluated by Dr. Ethelda Chick who guided the patient's management and agrees with plan.  Final Clinical Impressions(s) / ED Diagnoses   Final diagnoses:  Otalgia, right ear    New Prescriptions Discharge Medication List as of 10/15/2016  9:56 PM       Emi Holes, PA-C 10/22/16 1610    Doug Sou, MD 10/22/16 [id number]

## 2016-10-23 ENCOUNTER — Ambulatory Visit (HOSPITAL_COMMUNITY)
Admission: RE | Admit: 2016-10-23 | Discharge: 2016-10-23 | Disposition: A | Discharge status: Home / Self Care | Payer: Medicaid Other | Source: Ambulatory Visit | Attending: Obstetrics | Admitting: Obstetrics

## 2016-10-23 DIAGNOSIS — Z3480 Encounter for supervision of other normal pregnancy, unspecified trimester: Secondary | ICD-10-CM

## 2016-10-23 DIAGNOSIS — Z3A18 18 weeks gestation of pregnancy: Secondary | ICD-10-CM | POA: Diagnosis not present

## 2016-10-23 DIAGNOSIS — Z363 Encounter for antenatal screening for malformations: Secondary | ICD-10-CM | POA: Insufficient documentation

## 2016-10-23 DIAGNOSIS — Z3482 Encounter for supervision of other normal pregnancy, second trimester: Secondary | ICD-10-CM

## 2016-10-28 ENCOUNTER — Encounter: Payer: Medicaid Other | Admitting: Obstetrics

## 2016-11-08 NOTE — MAU Provider Note (Signed)
Chief Complaint: Abdominal Cramping; Back Pain; and Nasal Congestion   SUBJECTIVE HPI: [name] is a 26 y.o. [id number] at [redacted]w[redacted]d who presents to Maternity Admissions reporting cold-like symptoms, back pain, and abdominal cramping.  Patient has been having cold like symptoms of runny nose with congestion, dry cough, sore throat, and subjective chills without fevers at home. Patient has not taken any medications due to she did not know what to take. Has had some minimal N/V in the morning, but able to tolerate PO during day, not getting worse.   Also c/o back pain, states feels like muscle cramping in low back. Does not radiate, located in low lumbar. No injury. No urinary complaints.   Also c/o abdominal cramping, not severe. Feels like menstrual cramps.  Has noted some spotting, no heavy bleeding. Last spotting was last night. No bleeding today.     Past Medical History:  Diagnosis Date  . Depression   . Depression   . Mental disorder    depression  . Sickle cell trait (HCC)    OB History  Gravida Para Term Preterm AB Living  3 2 2  0 0 2  SAB TAB Ectopic Multiple Live Births  0 0 0 0 2    # Outcome Date GA Lbr Len/2nd Weight Sex Delivery Anes PTL Lv  3 Current           2 Term 09/06/12 [redacted]w[redacted]d 24:00 / 00:02 6 lb 10.8 oz (3.028 kg) F Vag-Spont EPI  LIV  1 Term 10/13/11 [redacted]w[redacted]d 20:13 / 02:34 7 lb 6.7 oz (3.365 kg) M Vag-Spont EPI  LIV     Birth Comments: Caput on head     Past Surgical History:  Procedure Laterality Date  . NO PAST SURGERIES    . WISDOM TOOTH EXTRACTION     Social History   Social History  . Marital status: Single    Spouse name: N/A  . Number of children: 2  . Years of education: associates   Occupational History  . Unemployed    Social History Main Topics  . Smoking status: Former Smoker    Packs/day: 0.05    Years: 6.00    Types: Cigarettes  . Smokeless tobacco: Never Used     Comment: stopped at beginning of pregnancy  . Alcohol use No  .  Drug use: Yes    Types: Other-see comments, Marijuana     Comment: positive marijuana per UDS this admission, 10/12/11  . Sexual activity: Yes    Partners: Male    Birth control/ protection: OCP   Other Topics Concern  . Not on file   Social History Narrative   Patient lives with [place] and 2 kids. Lives in a two story townhome.   Patient does not work.    Patient is left handed.   Patient has an associates degree      No current facility-administered medications on file prior to encounter.    Current Outpatient Prescriptions on File Prior to Encounter  Medication Sig Dispense Refill  . Prenatal Vit-FePoly-FA-DHA (VITAFOL-ONE) 29-1-200 MG CAPS Take 1 capsule by mouth daily before breakfast. 30 capsule 11   No Known Allergies  I have reviewed the past Medical Hx, Surgical Hx, Social Hx, Allergies and Medications.   REVIEW OF SYSTEMS  A comprehensive ROS was negative except per HPI.   OBJECTIVE Vitals:   08/14/16 0840 08/14/16 0847 08/14/16 0848 08/14/16 1221  BP:  118/83  114/67  Pulse:   77 75  Resp:  18 18  Temp:  98.2 F (36.8 C)    SpO2:   100%   Weight: 183 lb (83 kg)     Height: 5' 5.5" (1.664 m)       PHYSICAL EXAM Constitutional: Well-developed, well-nourished female in no acute distress.  Cardiovascular: normal rate, rhythm, no murmurs Respiratory: normal rate and effort, CTAB  GI: Abd soft, mild tenderness in lower mid-pelvis without guarding, non-distended. Pos BS x 4 MS: Extremities nontender, no edema, normal ROM; lower SI joint pain, no CVAT Neurologic: Alert and oriented x 4.  GU: Neg CVAT. SPECULUM EXAM: NEFG, physiologic discharge, no blood noted, cervix clean BIMANUAL: cervix; uterus normal size, no adnexal tenderness or masses. No CMT.   LAB RESULTS See chart  IMAGING CLINICAL DATA:  Mid to lower back pain and abdominal pain. Spotting for 2 days.  EXAM: OBSTETRIC <14 WK US AND TRANSVAGINAL OB US  TECHNIQUE: Both transabdominal and  transvaginal ultrasound examinations were performed for complete evaluation of the gestation as well as the maternal uterus, adnexal regions, and pelvic cul-de-sac. Transvaginal technique was performed to assess early pregnancy.  COMPARISON:  None.  FINDINGS: Intrauterine gestational sac: Single  Yolk sac:  Visualized  Embryo:  Visualized  Cardiac Activity: Visualized  Heart Rate: 157  bpm  MSD: 21.9  mm   8 w   4  d  CRL:    mm    w    d                  US EDC: 03/22/2017  Subchorionic hemorrhage:  Small subchorionic hemorrhage  Maternal uterus/adnexae: No adnexal masses or free fluid.  IMPRESSION: Eight week 4 day intrauterine pregnancy. Fetal heart rate 157 beats per minute. Small subchorionic hemorrhage.   Electronically Signed   By: Charlett NoseKevin  Dover M.D.   On: 08/14/2016 10:47   MAU COURSE TVUS - +cardiac activity; +subchorionic hemorrhage Wet prep- +BV GC/CT - pending UA - neg CBC/ABO/hcg   MDM Plan of care reviewed with patient, including labs and tests ordered and medical treatment.   ASSESSMENT 1. Bacterial vaginosis   2. Abdominal pain affecting pregnancy   3. Normal IUP (intrauterine pregnancy) on prenatal ultrasound, first trimester     PLAN Discharge home in stable condition. Safe medication list given Supportive care, hydration encouraged Treatment for BV Follow up with OB provider for initial OB visit Bleeding/SAB precautions given   [facility] Schedule an appointment as soon as possible for a visit in 2 week(s).   Why:  Initial OB appointment Or OB provider of your choice Contact information: [address] [place] [address] [phone]          Allergies as of 08/14/2016   No Known Allergies     Medication List    STOP taking these medications   ibuprofen 800 MG tablet Commonly known as:  ADVIL,MOTRIN   levonorgestrel-ethinyl estradiol 0.15-30 MG-MCG  tablet Commonly known as:  NORDETTE     TAKE these medications   VITAFOL-ONE 29-1-200 MG Caps Take 1 capsule by mouth daily before breakfast.     ASK your doctor about these medications   metroNIDAZOLE 0.75 % vaginal gel Commonly known as:  METROGEL VAGINAL Place 1 Applicatorful vaginally 2 (two) times daily. Ask about: Should I take this medication?        Jen MowElizabeth Audrey Eller, DO OB Fellow

## 2016-11-11 ENCOUNTER — Encounter: Payer: Self-pay | Admitting: Obstetrics

## 2016-11-11 ENCOUNTER — Ambulatory Visit (INDEPENDENT_AMBULATORY_CARE_PROVIDER_SITE_OTHER): Payer: Medicaid Other | Admitting: Obstetrics

## 2016-11-11 VITALS — BP 102/69 | HR 75 | Wt 185.2 lb

## 2016-11-11 DIAGNOSIS — Z3482 Encounter for supervision of other normal pregnancy, second trimester: Secondary | ICD-10-CM

## 2016-11-11 DIAGNOSIS — Z3480 Encounter for supervision of other normal pregnancy, unspecified trimester: Secondary | ICD-10-CM

## 2016-11-11 NOTE — Progress Notes (Signed)
Subjective:  [name] is a 26 y.o. G3P2002 at 2245w2d being seen today for ongoing prenatal care.  She is currently monitored for the following issues for this low-risk pregnancy and has Deliberate self-cutting; Low backache; Pain aggravated by physical activity; Backache; Psychogenic tremor; Carpal tunnel syndrome of right wrist; Tobacco dependence; and Encounter for supervision of other normal pregnancy on her problem list.  Patient reports no complaints.  Contractions: Not present. Vag. Bleeding: None.  Movement: Present. Denies leaking of fluid.   The following portions of the patient's history were reviewed and updated as appropriate: allergies, current medications, past family history, past medical history, past social history, past surgical history and problem list. Problem list updated.  Objective:   Vitals:   11/11/16 0914  BP: 102/69  Pulse: 75  Weight: 185 lb 3.2 oz (84 kg)    Fetal Status: Fetal Heart Rate (bpm): 150   Movement: Present     General:  Alert, oriented and cooperative. Patient is in no acute distress.  Skin: Skin is warm and dry. No rash noted.   Cardiovascular: Normal heart rate noted  Respiratory: Normal respiratory effort, no problems with respiration noted  Abdomen: Soft, gravid, appropriate for gestational age. Pain/Pressure: Absent     Pelvic:  Cervical exam deferred        Extremities: Normal range of motion.  Edema: None  Mental Status: Normal mood and affect. Normal behavior. Normal judgment and thought content.   Urinalysis:      Assessment and Plan:  Pregnancy: G3P2002 at 4045w2d  There are no diagnoses linked to this encounter. Preterm labor symptoms and general obstetric precautions including but not limited to vaginal bleeding, contractions, leaking of fluid and fetal movement were reviewed in detail with the patient. Please refer to After Visit Summary for other counseling recommendations.  No Follow-up on file.   Brock Badharles A Javaeh Muscatello,  MDPatient ID: Jeanette Ghaziochelle D Bazzano, female   DOB: 06-Mar-1991, 26 y.o.   MRN: [id number]

## 2016-11-11 NOTE — Progress Notes (Signed)
Patient reports she is still having daily vomiting.

## 2016-12-09 ENCOUNTER — Ambulatory Visit (INDEPENDENT_AMBULATORY_CARE_PROVIDER_SITE_OTHER): Payer: Medicaid Other | Admitting: Obstetrics

## 2016-12-09 ENCOUNTER — Encounter: Payer: Self-pay | Admitting: Obstetrics

## 2016-12-09 DIAGNOSIS — Z3480 Encounter for supervision of other normal pregnancy, unspecified trimester: Secondary | ICD-10-CM

## 2016-12-09 DIAGNOSIS — Z3482 Encounter for supervision of other normal pregnancy, second trimester: Secondary | ICD-10-CM

## 2016-12-09 NOTE — Progress Notes (Signed)
No concerns today per pt.  

## 2016-12-09 NOTE — Progress Notes (Signed)
Subjective:  [name] is a 26 y.o. G3P2002 at 5543w2d being seen today for ongoing prenatal care.  She is currently monitored for the following issues for this low-risk pregnancy and has Deliberate self-cutting; Low backache; Pain aggravated by physical activity; Backache; Psychogenic tremor; Carpal tunnel syndrome of right wrist; Tobacco dependence; and Encounter for supervision of other normal pregnancy on her problem list.  Patient reports no complaints.  Contractions: Not present. Vag. Bleeding: None.  Movement: Present. Denies leaking of fluid.   The following portions of the patient's history were reviewed and updated as appropriate: allergies, current medications, past family history, past medical history, past social history, past surgical history and problem list. Problem list updated.  Objective:   Vitals:   12/09/16 1357  BP: 100/67  Pulse: 82  Weight: 191 lb (86.6 kg)    Fetal Status:     Movement: Present     General:  Alert, oriented and cooperative. Patient is in no acute distress.  Skin: Skin is warm and dry. No rash noted.   Cardiovascular: Normal heart rate noted  Respiratory: Normal respiratory effort, no problems with respiration noted  Abdomen: Soft, gravid, appropriate for gestational age. Pain/Pressure: Absent     Pelvic:  Cervical exam deferred        Extremities: Normal range of motion.  Edema: None  Mental Status: Normal mood and affect. Normal behavior. Normal judgment and thought content.   Urinalysis:      Assessment and Plan:  Pregnancy: G3P2002 at 2743w2d  1. Supervision of other normal pregnancy, antepartum   Preterm labor symptoms and general obstetric precautions including but not limited to vaginal bleeding, contractions, leaking of fluid and fetal movement were reviewed in detail with the patient. Please refer to After Visit Summary for other counseling recommendations.  F/U 2 weeks   Brock Badharles A Annison Birchard, MDPatient ID: [name],  female   DOB: 1991-06-07, 26 y.o.   MRN: [id number]

## 2016-12-23 ENCOUNTER — Ambulatory Visit (INDEPENDENT_AMBULATORY_CARE_PROVIDER_SITE_OTHER): Payer: Medicaid Other | Admitting: Obstetrics

## 2016-12-23 ENCOUNTER — Other Ambulatory Visit: Payer: Medicaid Other

## 2016-12-23 ENCOUNTER — Encounter: Payer: Self-pay | Admitting: Obstetrics

## 2016-12-23 VITALS — BP 107/68 | HR 89 | Wt 189.0 lb

## 2016-12-23 DIAGNOSIS — Z23 Encounter for immunization: Secondary | ICD-10-CM | POA: Diagnosis not present

## 2016-12-23 DIAGNOSIS — Z3480 Encounter for supervision of other normal pregnancy, unspecified trimester: Secondary | ICD-10-CM

## 2016-12-23 DIAGNOSIS — Z3482 Encounter for supervision of other normal pregnancy, second trimester: Secondary | ICD-10-CM

## 2016-12-23 NOTE — Progress Notes (Signed)
Subjective:  [name] is a 26 y.o. G3P2002 at 4950w2d being seen today for ongoing prenatal care.  She is currently monitored for the following issues for this low-risk pregnancy and has Deliberate self-cutting; Low backache; Pain aggravated by physical activity; Backache; Psychogenic tremor; Carpal tunnel syndrome of right wrist; Tobacco dependence; and Encounter for supervision of other normal pregnancy on her problem list.  Patient reports no complaints.  Contractions: Not present. Vag. Bleeding: None.  Movement: Present. Denies leaking of fluid.   The following portions of the patient's history were reviewed and updated as appropriate: allergies, current medications, past family history, past medical history, past social history, past surgical history and problem list. Problem list updated.  Objective:   Vitals:   12/23/16 0917  BP: 107/68  Pulse: 89  Weight: 189 lb (85.7 kg)    Fetal Status:     Movement: Present     General:  Alert, oriented and cooperative. Patient is in no acute distress.  Skin: Skin is warm and dry. No rash noted.   Cardiovascular: Normal heart rate noted  Respiratory: Normal respiratory effort, no problems with respiration noted  Abdomen: Soft, gravid, appropriate for gestational age. Pain/Pressure: Absent     Pelvic:  Cervical exam deferred        Extremities: Normal range of motion.  Edema: None  Mental Status: Normal mood and affect. Normal behavior. Normal judgment and thought content.   Urinalysis:      Assessment and Plan:  Pregnancy: G3P2002 at 4050w2d  1. Supervision of other normal pregnancy, antepartum Rx: - Glucose Tolerance, 2 Hours w/1 Hour - CBC - HIV antibody - RPR - TSH - VITAMIN D 25 Hydroxy (Vit-D Deficiency, Fractures)  Preterm labor symptoms and general obstetric precautions including but not limited to vaginal bleeding, contractions, leaking of fluid and fetal movement were reviewed in detail with the patient. Please refer  to After Visit Summary for other counseling recommendations.  F/U in 2 weeks   Brock Badharles A [name], MDPatient ID: [name], female   DOB: [birth year], 26 y.o.   MRN: [id number]

## 2016-12-23 NOTE — Progress Notes (Signed)
Pt presents for ROB and 2 gtt today. Repeat TSH and Vit D today. No concerns per pt. Tdap literature and injection given today w/o difficulty.

## 2016-12-24 LAB — CBC
HEMOGLOBIN: 11.9 g/dL (ref 11.1–15.9)
Hematocrit: 35.9 % (ref 34.0–46.6)
MCH: 30.9 pg (ref 26.6–33.0)
MCHC: 33.1 g/dL (ref 31.5–35.7)
MCV: 93 fL (ref 79–97)
Platelets: 242 10*3/uL (ref 150–379)
RBC: 3.85 x10E6/uL (ref 3.77–5.28)
RDW: 14 % (ref 12.3–15.4)
WBC: 11.5 10*3/uL — ABNORMAL HIGH (ref 3.4–10.8)

## 2016-12-24 LAB — GLUCOSE TOLERANCE, 2 HOURS W/ 1HR
GLUCOSE, 1 HOUR: 143 mg/dL (ref 65–179)
GLUCOSE, 2 HOUR: 99 mg/dL (ref 65–152)
Glucose, Fasting: 80 mg/dL (ref 65–91)

## 2016-12-24 LAB — HIV ANTIBODY (ROUTINE TESTING W REFLEX): HIV SCREEN 4TH GENERATION: NONREACTIVE

## 2016-12-24 LAB — TSH: TSH: 1.4 u[IU]/mL (ref 0.450–4.500)

## 2016-12-24 LAB — RPR: RPR: NONREACTIVE

## 2016-12-24 LAB — VITAMIN D 25 HYDROXY (VIT D DEFICIENCY, FRACTURES): Vit D, 25-Hydroxy: 16 ng/mL — ABNORMAL LOW (ref 30.0–100.0)

## 2017-01-06 ENCOUNTER — Encounter: Payer: Self-pay | Admitting: Obstetrics

## 2017-01-06 ENCOUNTER — Ambulatory Visit (INDEPENDENT_AMBULATORY_CARE_PROVIDER_SITE_OTHER): Payer: Medicaid Other | Admitting: Obstetrics

## 2017-01-06 VITALS — BP 111/68 | HR 89 | Wt 192.0 lb

## 2017-01-06 DIAGNOSIS — Z3480 Encounter for supervision of other normal pregnancy, unspecified trimester: Secondary | ICD-10-CM

## 2017-01-06 DIAGNOSIS — Z3483 Encounter for supervision of other normal pregnancy, third trimester: Secondary | ICD-10-CM

## 2017-01-06 NOTE — Progress Notes (Signed)
Subjective:  [name] Patient is a 26 y.o. G3P2002 at 5740w2d being seen today for ongoing prenatal care.  She is currently monitored for the following issues for this low-risk pregnancy and has Deliberate self-cutting; Low backache; Pain aggravated by physical activity; Backache; Psychogenic tremor; Carpal tunnel syndrome of right wrist; Tobacco dependence; and Encounter for supervision of other normal pregnancy on her problem list.  Patient reports no complaints.  Contractions: Irritability. Vag. Bleeding: None.  Movement: Present. Denies leaking of fluid.   The following portions of the patient's history were reviewed and updated as appropriate: allergies, current medications, past family history, past medical history, past social history, past surgical history and problem list. Problem list updated.  Objective:   Vitals:   01/06/17 1521  BP: 111/68  Pulse: 89  Weight: 192 lb (87.1 kg)    Fetal Status: Fetal Heart Rate (bpm): 150   Movement: Present     General:  Alert, oriented and cooperative. Patient is in no acute distress.  Skin: Skin is warm and dry. No rash noted.   Cardiovascular: Normal heart rate noted  Respiratory: Normal respiratory effort, no problems with respiration noted  Abdomen: Soft, gravid, appropriate for gestational age. Pain/Pressure: Present     Pelvic:  Cervical exam deferred        Extremities: Normal range of motion.  Edema: None  Mental Status: Normal mood and affect. Normal behavior. Normal judgment and thought content.   Urinalysis:      Assessment and Plan:  Pregnancy: G3P2002 at 4140w2d  There are no diagnoses linked to this encounter. Preterm labor symptoms and general obstetric precautions including but not limited to vaginal bleeding, contractions, leaking of fluid and fetal movement were reviewed in detail with the patient. Please refer to After Visit Summary for other counseling recommendations.  No Follow-up on file.   Brock Badharles A Josie Mesa,  MDPatient ID: [name], female   DOB: 09/06/1991, 26 y.o.   MRN: [id number]

## 2017-01-20 ENCOUNTER — Encounter: Payer: Self-pay | Admitting: Obstetrics

## 2017-01-20 ENCOUNTER — Encounter: Payer: Self-pay | Admitting: *Deleted

## 2017-01-20 ENCOUNTER — Ambulatory Visit (INDEPENDENT_AMBULATORY_CARE_PROVIDER_SITE_OTHER): Payer: Medicaid Other | Admitting: Obstetrics

## 2017-01-20 VITALS — BP 104/59 | HR 94

## 2017-01-20 DIAGNOSIS — Z3480 Encounter for supervision of other normal pregnancy, unspecified trimester: Secondary | ICD-10-CM

## 2017-01-20 DIAGNOSIS — Z3483 Encounter for supervision of other normal pregnancy, third trimester: Secondary | ICD-10-CM

## 2017-01-20 NOTE — Patient Instructions (Addendum)

## 2017-01-20 NOTE — Progress Notes (Signed)
Subjective:  [name] is a 26 y.o. G3P2002 at [redacted]w[redacted]d being seen today for ongoing prenatal care.  She is currently monitored for the following issues for this low-risk pregnancy and has Deliberate self-cutting; Low backache; Pain aggravated by physical activity; Backache; Psychogenic tremor; Carpal tunnel syndrome of right wrist; Tobacco dependence; and Encounter for supervision of other normal pregnancy on her problem list.  Patient reports occasional contractions.  Contractions: Irregular. Vag. Bleeding: None.  Movement: Present. Denies leaking of fluid.   The following portions of the patient's history were reviewed and updated as appropriate: allergies, current medications, past family history, past medical history, past social history, past surgical history and problem list. Problem list updated.  Objective:   Vitals:   01/20/17 1356  BP: (!) 104/59  Pulse: 94    Fetal Status:     Movement: Present     General:  Alert, oriented and cooperative. Patient is in no acute distress.  Skin: Skin is warm and dry. No rash noted.   Cardiovascular: Normal heart rate noted  Respiratory: Normal respiratory effort, no problems with respiration noted  Abdomen: Soft, gravid, appropriate for gestational age. Pain/Pressure: Present     Pelvic:  Cervical exam deferred        Extremities: Normal range of motion.  Edema: None  Mental Status: Normal mood and affect. Normal behavior. Normal judgment and thought content.   Urinalysis:      Assessment and Plan:  Pregnancy: G3P2002 at [redacted]w[redacted]d  There are no diagnoses linked to this encounter. Preterm labor symptoms and general obstetric precautions including but not limited to vaginal bleeding, contractions, leaking of fluid and fetal movement were reviewed in detail with the patient. Please refer to After Visit Summary for other counseling recommendations.  Return in 4 weeks (on 02/17/2017).   Brock Bad, MDPatient ID: Eilene Ghazi,  female   DOB: 1990/11/26, 26 y.o.   MRN: [id number]

## 2017-01-28 ENCOUNTER — Encounter: Payer: Self-pay | Admitting: *Deleted

## 2017-01-31 ENCOUNTER — Encounter: Payer: Self-pay | Admitting: Obstetrics

## 2017-02-17 ENCOUNTER — Ambulatory Visit (INDEPENDENT_AMBULATORY_CARE_PROVIDER_SITE_OTHER): Payer: Medicaid Other | Admitting: Obstetrics

## 2017-02-17 ENCOUNTER — Encounter: Payer: Self-pay | Admitting: Obstetrics

## 2017-02-17 VITALS — BP 110/72 | HR 77 | Wt 198.2 lb

## 2017-02-17 DIAGNOSIS — Z3483 Encounter for supervision of other normal pregnancy, third trimester: Secondary | ICD-10-CM

## 2017-02-17 DIAGNOSIS — Z3480 Encounter for supervision of other normal pregnancy, unspecified trimester: Secondary | ICD-10-CM

## 2017-02-17 NOTE — Progress Notes (Signed)
Subjective:  Eilene GhaziRochelle D Canty is a 26 y.o. G3P2002 at 184w2d being seen today for ongoing prenatal care.  She is currently monitored for the following issues for this low-risk pregnancy and has Deliberate self-cutting; Low backache; Pain aggravated by physical activity; Backache; Psychogenic tremor; Carpal tunnel syndrome of right wrist; Tobacco dependence; and Encounter for supervision of other normal pregnancy on her problem list.  Patient reports no complaints.  Contractions: Irregular. Vag. Bleeding: None.  Movement: Present. Denies leaking of fluid.   The following portions of the patient's history were reviewed and updated as appropriate: allergies, current medications, past family history, past medical history, past social history, past surgical history and problem list. Problem list updated.  Objective:   Vitals:   02/17/17 1008  BP: 110/72  Pulse: 77  Weight: 198 lb 3.2 oz (89.9 kg)    Fetal Status: Fetal Heart Rate (bpm): 150   Movement: Present     General:  Alert, oriented and cooperative. Patient is in no acute distress.  Skin: Skin is warm and dry. No rash noted.   Cardiovascular: Normal heart rate noted  Respiratory: Normal respiratory effort, no problems with respiration noted  Abdomen: Soft, gravid, appropriate for gestational age. Pain/Pressure: Absent     Pelvic:  Cervical exam deferred        Extremities: Normal range of motion.  Edema: None  Mental Status: Normal mood and affect. Normal behavior. Normal judgment and thought content.   Urinalysis:      Assessment and Plan:  Pregnancy: G3P2002 at 664w2d  1. Encounter for supervision of other normal pregnancy in third trimester Rx: - Strep Gp B NAA  Preterm labor symptoms and general obstetric precautions including but not limited to vaginal bleeding, contractions, leaking of fluid and fetal movement were reviewed in detail with the patient. Please refer to After Visit Summary for other counseling  recommendations.  Return in 1 week (on 02/24/2017) for ROB.   Brock BadHarper, Ila Landowski A, MDPatient ID: Eilene Ghaziochelle D Lemoine, female   DOB: [birth year], 26 y.o.   MRN: [id number]

## 2017-02-19 LAB — STREP GP B NAA: STREP GROUP B AG: NEGATIVE

## 2017-02-23 ENCOUNTER — Encounter: Payer: Self-pay | Admitting: Obstetrics

## 2017-02-23 ENCOUNTER — Ambulatory Visit (INDEPENDENT_AMBULATORY_CARE_PROVIDER_SITE_OTHER): Payer: Medicaid Other | Admitting: Obstetrics

## 2017-02-23 VITALS — BP 115/69 | HR 89 | Wt 198.0 lb

## 2017-02-23 DIAGNOSIS — Z3483 Encounter for supervision of other normal pregnancy, third trimester: Secondary | ICD-10-CM

## 2017-02-23 DIAGNOSIS — Z3480 Encounter for supervision of other normal pregnancy, unspecified trimester: Secondary | ICD-10-CM

## 2017-02-23 NOTE — Progress Notes (Signed)
Subjective:  [name] is a 26 y.o. N8G9562G3P2002 at 1048w1d being seen today for ongoing prenatal care.  She is currently monitored for the following issues for this low-risk pregnancy and has Deliberate self-cutting; Low backache; Pain aggravated by physical activity; Backache; Psychogenic tremor; Carpal tunnel syndrome of right wrist; Tobacco dependence; and Encounter for supervision of other normal pregnancy on her problem list.  Patient reports no complaints.  Contractions: Irregular. Vag. Bleeding: None.  Movement: Present. Denies leaking of fluid.   The following portions of the patient's history were reviewed and updated as appropriate: allergies, current medications, past family history, past medical history, past social history, past surgical history and problem list. Problem list updated.  Objective:   Vitals:   02/23/17 1055  BP: 115/69  Pulse: 89  Weight: 198 lb (89.8 kg)    Fetal Status: Fetal Heart Rate (bpm): 150   Movement: Present     General:  Alert, oriented and cooperative. Patient is in no acute distress.  Skin: Skin is warm and dry. No rash noted.   Cardiovascular: Normal heart rate noted  Respiratory: Normal respiratory effort, no problems with respiration noted  Abdomen: Soft, gravid, appropriate for gestational age. Pain/Pressure: Absent     Pelvic:  Cervical exam deferred        Extremities: Normal range of motion.  Edema: None  Mental Status: Normal mood and affect. Normal behavior. Normal judgment and thought content.   Urinalysis:      Assessment and Plan:  Pregnancy: G3P2002 at 4648w1d  There are no diagnoses linked to this encounter. Term labor symptoms and general obstetric precautions including but not limited to vaginal bleeding, contractions, leaking of fluid and fetal movement were reviewed in detail with the patient. Please refer to After Visit Summary for other counseling recommendations.  Return in 1 week (on 03/02/2017) for ROB.   Brock BadHarper,  Charles A, MDPatient ID: [name], female   DOB: April 30, 1991, 26 y.o.   MRN: [id number]

## 2017-03-02 ENCOUNTER — Encounter: Payer: Self-pay | Admitting: *Deleted

## 2017-03-02 ENCOUNTER — Ambulatory Visit (INDEPENDENT_AMBULATORY_CARE_PROVIDER_SITE_OTHER): Payer: Medicaid Other | Admitting: Certified Nurse Midwife

## 2017-03-02 ENCOUNTER — Encounter: Payer: Self-pay | Admitting: Certified Nurse Midwife

## 2017-03-02 VITALS — BP 114/70 | HR 92 | Wt 199.0 lb

## 2017-03-02 DIAGNOSIS — O99613 Diseases of the digestive system complicating pregnancy, third trimester: Secondary | ICD-10-CM

## 2017-03-02 DIAGNOSIS — E559 Vitamin D deficiency, unspecified: Secondary | ICD-10-CM

## 2017-03-02 DIAGNOSIS — K219 Gastro-esophageal reflux disease without esophagitis: Secondary | ICD-10-CM

## 2017-03-02 DIAGNOSIS — Z3480 Encounter for supervision of other normal pregnancy, unspecified trimester: Secondary | ICD-10-CM

## 2017-03-02 DIAGNOSIS — R7989 Other specified abnormal findings of blood chemistry: Secondary | ICD-10-CM

## 2017-03-02 DIAGNOSIS — Z3483 Encounter for supervision of other normal pregnancy, third trimester: Secondary | ICD-10-CM

## 2017-03-02 DIAGNOSIS — O219 Vomiting of pregnancy, unspecified: Secondary | ICD-10-CM

## 2017-03-02 MED ORDER — ONDANSETRON HCL 8 MG PO TABS: 8.0000 mg | ORAL_TABLET | Freq: Three times a day (TID) | ORAL | 2 refills | Status: DC | PRN

## 2017-03-02 MED ORDER — VITAMIN D (ERGOCALCIFEROL) 1.25 MG (50000 UNIT) PO CAPS: 50000.0000 [IU] | ORAL_CAPSULE | ORAL | 2 refills | Status: DC

## 2017-03-02 MED ORDER — OMEPRAZOLE 20 MG PO CPDR: 20.0000 mg | DELAYED_RELEASE_CAPSULE | Freq: Two times a day (BID) | ORAL | 5 refills | Status: DC

## 2017-03-02 NOTE — Progress Notes (Signed)
Patient complains of vomiting again.

## 2017-03-02 NOTE — Progress Notes (Signed)
   PRENATAL VISIT NOTE  Subjective:  [name] is a 26 y.o. O9G2952G3P2002 at 7715w1d being seen today for ongoing prenatal care.  She is currently monitored for the following issues for this low-risk pregnancy and has Deliberate self-cutting; Low backache; Pain aggravated by physical activity; Backache; Psychogenic tremor; Carpal tunnel syndrome of right wrist; Tobacco dependence; Supervision of other normal pregnancy, antepartum; and Low vitamin D level on her problem list.  Patient reports no complaints.  Contractions: Not present. Vag. Bleeding: None.  Movement: Present. Denies leaking of fluid.   The following portions of the patient's history were reviewed and updated as appropriate: allergies, current medications, past family history, past medical history, past social history, past surgical history and problem list. Problem list updated.  Objective:   Vitals:   03/02/17 1115  BP: 114/70  Pulse: 92  Weight: 199 lb (90.3 kg)    Fetal Status: Fetal Heart Rate (bpm): 145 Fundal Height: 37 cm Movement: Present  Presentation: Vertex  General:  Alert, oriented and cooperative. Patient is in no acute distress.  Skin: Skin is warm and dry. No rash noted.   Cardiovascular: Normal heart rate noted  Respiratory: Normal respiratory effort, no problems with respiration noted  Abdomen: Soft, gravid, appropriate for gestational age. Pain/Pressure: Absent     Pelvic:  Cervical exam performed Dilation: Closed Effacement (%): 0 Station: -3  Extremities: Normal range of motion.  Edema: None  Mental Status: Normal mood and affect. Normal behavior. Normal judgment and thought content.   Assessment and Plan:  Pregnancy: G3P2002 at 6415w1d  1. Supervision of other normal pregnancy, antepartum Doing well  2. Low vitamin D level      - Vitamin D, Ergocalciferol, (DRISDOL) 50000 units CAPS capsule; Take 1 capsule (50,000 Units total) by mouth every 7 (seven) days.  Dispense: 30 capsule; Refill:  2  3. Nausea and vomiting during pregnancy     ?r/t reflux, vomiting during the night and early morning.  - ondansetron (ZOFRAN) 8 MG tablet; Take 1 tablet (8 mg total) by mouth every 8 (eight) hours as needed for nausea or vomiting.  Dispense: 40 tablet; Refill: 2  4. Gastroesophageal reflux during pregnancy in third trimester, antepartum     Reports reflux symptoms after eating.   - omeprazole (PRILOSEC) 20 MG capsule; Take 1 capsule (20 mg total) by mouth 2 (two) times daily before a meal.  Dispense: 60 capsule; Refill: 5  Term labor symptoms and general obstetric precautions including but not limited to vaginal bleeding, contractions, leaking of fluid and fetal movement were reviewed in detail with the patient. Please refer to After Visit Summary for other counseling recommendations.  Return in about 1 week (around 03/09/2017) for ROB.   Roe Coombsachelle A Hester Forget, CNM

## 2017-03-06 ENCOUNTER — Encounter: Payer: Self-pay | Admitting: Certified Nurse Midwife

## 2017-03-09 ENCOUNTER — Inpatient Hospital Stay (HOSPITAL_COMMUNITY): Payer: Medicaid Other | Admitting: Anesthesiology

## 2017-03-09 ENCOUNTER — Encounter (HOSPITAL_COMMUNITY): Payer: Self-pay

## 2017-03-09 ENCOUNTER — Inpatient Hospital Stay (HOSPITAL_COMMUNITY)
Admission: AD | Admit: 2017-03-09 | Discharge: 2017-03-10 | DRG: 775 | Disposition: A | Discharge status: Home / Self Care | Payer: Medicaid Other | Source: Ambulatory Visit | Attending: Obstetrics and Gynecology | Admitting: Obstetrics and Gynecology

## 2017-03-09 DIAGNOSIS — Z87891 Personal history of nicotine dependence: Secondary | ICD-10-CM | POA: Diagnosis not present

## 2017-03-09 DIAGNOSIS — F329 Major depressive disorder, single episode, unspecified: Secondary | ICD-10-CM | POA: Diagnosis present

## 2017-03-09 DIAGNOSIS — D573 Sickle-cell trait: Secondary | ICD-10-CM | POA: Diagnosis present

## 2017-03-09 DIAGNOSIS — F32A Depression, unspecified: Secondary | ICD-10-CM | POA: Diagnosis present

## 2017-03-09 DIAGNOSIS — Z3A38 38 weeks gestation of pregnancy: Secondary | ICD-10-CM | POA: Diagnosis not present

## 2017-03-09 DIAGNOSIS — O9902 Anemia complicating childbirth: Secondary | ICD-10-CM | POA: Diagnosis present

## 2017-03-09 DIAGNOSIS — Z3493 Encounter for supervision of normal pregnancy, unspecified, third trimester: Secondary | ICD-10-CM | POA: Diagnosis present

## 2017-03-09 LAB — CBC
HCT: 36.8 % (ref 36.0–46.0)
HEMOGLOBIN: 12.5 g/dL (ref 12.0–15.0)
MCH: 30.6 pg (ref 26.0–34.0)
MCHC: 34 g/dL (ref 30.0–36.0)
MCV: 90.2 fL (ref 78.0–100.0)
PLATELETS: 189 10*3/uL (ref 150–400)
RBC: 4.08 MIL/uL (ref 3.87–5.11)
RDW: 13.3 % (ref 11.5–15.5)
WBC: 10.6 10*3/uL — AB (ref 4.0–10.5)

## 2017-03-09 LAB — TYPE AND SCREEN
ABO/RH(D): A POS
ANTIBODY SCREEN: NEGATIVE

## 2017-03-09 MED ORDER — OXYCODONE-ACETAMINOPHEN 5-325 MG PO TABS: 2.0000 | ORAL_TABLET | ORAL | Status: DC | PRN

## 2017-03-09 MED ORDER — LIDOCAINE HCL (PF) 1 % IJ SOLN
30.0000 mL | INTRAMUSCULAR | Status: DC | PRN
  Filled 2017-03-09: qty 30

## 2017-03-09 MED ORDER — IBUPROFEN 600 MG PO TABS
600.0000 mg | ORAL_TABLET | Freq: Four times a day (QID) | ORAL | Status: DC
  Administered 2017-03-09 – 2017-03-10 (×6): 600 mg via ORAL
  Filled 2017-03-09 (×6): qty 1

## 2017-03-09 MED ORDER — FENTANYL CITRATE (PF) 100 MCG/2ML IJ SOLN
100.0000 ug | Freq: Once | INTRAMUSCULAR | Status: AC
  Administered 2017-03-09: 100 ug via INTRAMUSCULAR
  Filled 2017-03-09: qty 2

## 2017-03-09 MED ORDER — DIPHENHYDRAMINE HCL 25 MG PO CAPS: 25.0000 mg | ORAL_CAPSULE | Freq: Four times a day (QID) | ORAL | Status: DC | PRN

## 2017-03-09 MED ORDER — FENTANYL 2.5 MCG/ML BUPIVACAINE 1/10 % EPIDURAL INFUSION (WH - ANES)
14.0000 mL/h | INTRAMUSCULAR | Status: DC | PRN
  Administered 2017-03-09: 14 mL/h via EPIDURAL
  Filled 2017-03-09: qty 100

## 2017-03-09 MED ORDER — DIPHENHYDRAMINE HCL 50 MG/ML IJ SOLN: 12.5000 mg | INTRAMUSCULAR | Status: DC | PRN

## 2017-03-09 MED ORDER — SIMETHICONE 80 MG PO CHEW: 80.0000 mg | CHEWABLE_TABLET | ORAL | Status: DC | PRN

## 2017-03-09 MED ORDER — WITCH HAZEL-GLYCERIN EX PADS: 1.0000 "application " | MEDICATED_PAD | CUTANEOUS | Status: DC | PRN

## 2017-03-09 MED ORDER — ACETAMINOPHEN 325 MG PO TABS: 650.0000 mg | ORAL_TABLET | ORAL | Status: DC | PRN

## 2017-03-09 MED ORDER — OXYCODONE-ACETAMINOPHEN 5-325 MG PO TABS: 1.0000 | ORAL_TABLET | ORAL | Status: DC | PRN

## 2017-03-09 MED ORDER — EPHEDRINE 5 MG/ML INJ
10.0000 mg | INTRAVENOUS | Status: DC | PRN
  Filled 2017-03-09: qty 2

## 2017-03-09 MED ORDER — PRENATAL MULTIVITAMIN CH
1.0000 | ORAL_TABLET | Freq: Every day | ORAL | Status: DC
  Administered 2017-03-09 – 2017-03-10 (×2): 1 via ORAL
  Filled 2017-03-09 (×2): qty 1

## 2017-03-09 MED ORDER — OXYTOCIN BOLUS FROM INFUSION
500.0000 mL | Freq: Once | INTRAVENOUS | Status: AC
  Administered 2017-03-09: 500 mL via INTRAVENOUS

## 2017-03-09 MED ORDER — PHENYLEPHRINE 40 MCG/ML (10ML) SYRINGE FOR IV PUSH (FOR BLOOD PRESSURE SUPPORT)
80.0000 ug | PREFILLED_SYRINGE | INTRAVENOUS | Status: DC | PRN
  Filled 2017-03-09: qty 5
  Filled 2017-03-09: qty 10

## 2017-03-09 MED ORDER — ONDANSETRON HCL 4 MG/2ML IJ SOLN: 4.0000 mg | INTRAMUSCULAR | Status: DC | PRN

## 2017-03-09 MED ORDER — SENNOSIDES-DOCUSATE SODIUM 8.6-50 MG PO TABS
2.0000 | ORAL_TABLET | ORAL | Status: DC
  Administered 2017-03-09: 2 via ORAL
  Filled 2017-03-09: qty 2

## 2017-03-09 MED ORDER — ONDANSETRON HCL 4 MG/2ML IJ SOLN: 4.0000 mg | Freq: Four times a day (QID) | INTRAMUSCULAR | Status: DC | PRN

## 2017-03-09 MED ORDER — ZOLPIDEM TARTRATE 5 MG PO TABS: 5.0000 mg | ORAL_TABLET | Freq: Every evening | ORAL | Status: DC | PRN

## 2017-03-09 MED ORDER — FENTANYL CITRATE (PF) 100 MCG/2ML IJ SOLN: 100.0000 ug | INTRAMUSCULAR | Status: DC | PRN

## 2017-03-09 MED ORDER — COCONUT OIL OIL: 1.0000 "application " | TOPICAL_OIL | Status: DC | PRN

## 2017-03-09 MED ORDER — LIDOCAINE HCL (PF) 1 % IJ SOLN
INTRAMUSCULAR | Status: DC | PRN
  Administered 2017-03-09 (×2): 7 mL via EPIDURAL

## 2017-03-09 MED ORDER — BENZOCAINE-MENTHOL 20-0.5 % EX AERO
1.0000 "application " | INHALATION_SPRAY | CUTANEOUS | Status: DC | PRN
  Administered 2017-03-09: 1 via TOPICAL
  Filled 2017-03-09: qty 56

## 2017-03-09 MED ORDER — SOD CITRATE-CITRIC ACID 500-334 MG/5ML PO SOLN: 30.0000 mL | ORAL | Status: DC | PRN

## 2017-03-09 MED ORDER — OXYTOCIN 40 UNITS IN LACTATED RINGERS INFUSION - SIMPLE MED
2.5000 [IU]/h | INTRAVENOUS | Status: DC
  Filled 2017-03-09: qty 1000

## 2017-03-09 MED ORDER — LACTATED RINGERS IV SOLN
INTRAVENOUS | Status: DC
  Administered 2017-03-09: 05:00:00 via INTRAVENOUS

## 2017-03-09 MED ORDER — LACTATED RINGERS IV SOLN: 500.0000 mL | Freq: Once | INTRAVENOUS | Status: DC

## 2017-03-09 MED ORDER — LACTATED RINGERS IV SOLN: 500.0000 mL | INTRAVENOUS | Status: DC | PRN

## 2017-03-09 MED ORDER — ONDANSETRON HCL 4 MG PO TABS: 4.0000 mg | ORAL_TABLET | ORAL | Status: DC | PRN

## 2017-03-09 MED ORDER — TETANUS-DIPHTH-ACELL PERTUSSIS 5-2.5-18.5 LF-MCG/0.5 IM SUSP: 0.5000 mL | Freq: Once | INTRAMUSCULAR | Status: DC

## 2017-03-09 MED ORDER — PHENYLEPHRINE 40 MCG/ML (10ML) SYRINGE FOR IV PUSH (FOR BLOOD PRESSURE SUPPORT)
80.0000 ug | PREFILLED_SYRINGE | INTRAVENOUS | Status: DC | PRN
  Filled 2017-03-09: qty 5

## 2017-03-09 MED ORDER — DIBUCAINE 1 % RE OINT: 1.0000 "application " | TOPICAL_OINTMENT | RECTAL | Status: DC | PRN

## 2017-03-09 NOTE — Anesthesia Procedure Notes (Signed)
Epidural Patient location during procedure: OB Start time: 03/09/2017 6:02 AM End time: 03/09/2017 6:06 AM  Staffing Anesthesiologist: Leilani AbleHATCHETT, Caylen Kuwahara Performed: anesthesiologist   Preanesthetic Checklist Completed: patient identified, surgical consent, pre-op evaluation, timeout performed, IV checked, risks and benefits discussed and monitors and equipment checked  Epidural Patient position: sitting Prep: site prepped and draped and DuraPrep Patient monitoring: continuous pulse ox and blood pressure Approach: midline Location: L3-L4 Injection technique: LOR air  Needle:  Needle type: Tuohy  Needle gauge: 17 G Needle length: 9 cm and 9 Needle insertion depth: 5 cm cm Catheter type: closed end flexible Catheter size: 19 Gauge Catheter at skin depth: 10 cm Test dose: negative and Other  Assessment Sensory level: T9 Events: blood not aspirated, injection not painful, no injection resistance, negative IV test and no paresthesia

## 2017-03-09 NOTE — Anesthesia Postprocedure Evaluation (Signed)
Anesthesia Post Note  Patient: Eilene GhaziRochelle D Fredericksen  Procedure(s) Performed: * No procedures listed *  Patient location during evaluation: Mother Baby Anesthesia Type: Epidural Level of consciousness: awake and alert and oriented Pain management: pain level controlled Vital Signs Assessment: post-procedure vital signs reviewed and stable Respiratory status: spontaneous breathing and nonlabored ventilation Cardiovascular status: stable Postop Assessment: no headache, no backache, epidural receding, patient able to bend at knees, no signs of nausea or vomiting and adequate PO intake Anesthetic complications: no        Last Vitals:  Vitals:   03/09/17 0908 03/09/17 1004  BP: 124/67 124/61  Pulse: (!) 57 (!) 58  Resp: 18 18  Temp: 36.6 C 36.9 C    Last Pain:  Vitals:   03/09/17 1012  TempSrc:   PainSc: 0-No pain   Pain Goal:                 Land O'LakesMalinova,Nancie Bocanegra Hristova

## 2017-03-09 NOTE — Anesthesia Preprocedure Evaluation (Signed)
Anesthesia Evaluation  Patient identified by MRN, date of birth, ID band Patient awake    Reviewed: Allergy & Precautions, H&P , Patient's Chart, lab work & pertinent test results  Airway Mallampati: I  TM Distance: >3 FB Neck ROM: full    Dental no notable dental hx.    Pulmonary former smoker,    Pulmonary exam normal breath sounds clear to auscultation       Cardiovascular negative cardio ROS Normal cardiovascular exam Rhythm:regular Rate:Normal     Neuro/Psych    GI/Hepatic Neg liver ROS, GERD  ,  Endo/Other  negative endocrine ROS  Renal/GU negative Renal ROS     Musculoskeletal   Abdominal (+) + obese,   Peds  Hematology negative hematology ROS (+)   Anesthesia Other Findings   Reproductive/Obstetrics (+) Pregnancy                             Anesthesia Physical Anesthesia Plan  ASA: II  Anesthesia Plan: Epidural   Post-op Pain Management:    Induction:   Airway Management Planned:   Additional Equipment:   Intra-op Plan:   Post-operative Plan:   Informed Consent: I have reviewed the patients History and Physical, chart, labs and discussed the procedure including the risks, benefits and alternatives for the proposed anesthesia with the patient or authorized representative who has indicated his/her understanding and acceptance.     Plan Discussed with:   Anesthesia Plan Comments:         Anesthesia Quick Evaluation

## 2017-03-09 NOTE — MAU Note (Signed)
Pt reports contractions every 5 mins. Pt denies LOF, has some bloody mucous. Reports good fetal movement. Was closed on last exam.

## 2017-03-09 NOTE — Progress Notes (Signed)
UR chart review completed.  

## 2017-03-09 NOTE — H&P (Signed)
LABOR AND DELIVERY ADMISSION HISTORY AND PHYSICAL NOTE  Eilene GhaziRochelle D Galligan is a 26 y.o. female [phone] with IUP at 6346w1d by LMP presenting for SOL. She reports contraction around 330AM.  She reports positive fetal movement. She denies leakage of fluid or vaginal bleeding.  Prenatal History/Complications:  Past Medical History: Past Medical History:  Diagnosis Date  . Depression   . Depression   . Mental disorder    depression  . Sickle cell trait [facility](HCC)     Past Surgical History: Past Surgical History:  Procedure Laterality Date  . NO PAST SURGERIES    . WISDOM TOOTH EXTRACTION      Obstetrical History: OB History    Gravida Para Term Preterm AB Living   3 2 2  0 0 2   SAB TAB Ectopic Multiple Live Births   0 0 0 0 2      Social History: Social History   Social History  . Marital status: Single    Spouse name: N/A  . Number of children: 2  . Years of education: associates   Occupational History  . Unemployed    Social History Main Topics  . Smoking status: Former Smoker    Packs/day: 0.05    Years: 6.00    Types: Cigarettes  . Smokeless tobacco: Never Used     Comment: stopped at beginning of pregnancy  . Alcohol use No  . Drug use: Yes    Types: Other-see comments, Marijuana     Comment: positive marijuana per UDS this admission, 10/12/11  . Sexual activity: Yes    Partners: Male    Birth control/ protection: None   Other Topics Concern  . None   Social History Narrative   Patient lives with Fiance and 2 kids. Lives in a two story townhome.   Patient does not work.    Patient is left handed.   Patient has an associates degree       Family History: Family History  Problem Relation Age of Onset  . Bipolar disorder Mother   . Schizophrenia Mother   . Anesthesia problems Neg Hx   . Hypotension Neg Hx   . Malignant hyperthermia Neg Hx   . Pseudochol deficiency Neg Hx     Allergies: No Known Allergies  Prescriptions Prior to Admission   Medication Sig Dispense Refill Last Dose  . Doxylamine-Pyridoxine (DICLEGIS) 10-10 MG TBEC Take 1 tablet with breakfast and lunch.  Take 2 tablets at bedtime. 100 tablet 4 Taking  . omeprazole (PRILOSEC) 20 MG capsule Take 1 capsule (20 mg total) by mouth 2 (two) times daily before a meal. 60 capsule 5   . ondansetron (ZOFRAN) 8 MG tablet Take 1 tablet (8 mg total) by mouth every 8 (eight) hours as needed for nausea or vomiting. 40 tablet 2   . Prenatal Vit-FePoly-FA-DHA (VITAFOL-ONE) 29-1-200 MG CAPS Take 1 capsule by mouth daily before breakfast. 30 capsule 11 Taking  . Vitamin D, Ergocalciferol, (DRISDOL) 50000 units CAPS capsule Take 1 capsule (50,000 Units total) by mouth every 7 (seven) days. 30 capsule 2      Review of Systems   All systems reviewed and negative except as stated in HPI  Blood pressure 119/69, pulse (!) 52, temperature 98 F (36.7 C), temperature source Oral, resp. rate 18, height 5' 5.5" (1.664 m), weight 199 lb (90.3 kg), last menstrual period 06/15/2016, SpO2 99 %. General appearance: alert, cooperative and appears stated age Lungs: clear to auscultation bilaterally Heart: regular rate and rhythm  Abdomen: soft, non-tender; bowel sounds normal Extremities: No calf swelling or tenderness Presentation: cephalic by my exam Fetal monitoring: category 1 Uterine activity: contractions every 3-5 minutes Dilation: Lip/rim Effacement (%): 100 Station: -1 Exam by:: N.Dinesh Ulysse,MD   Prenatal labs: ABO, Rh: A/Positive/-- (11/07 1437) Antibody: Negative (11/07 1437) Rubella: immune RPR: Non Reactive (03/14 0955)  HBsAg: Negative (11/07 1437)  HIV: Non Reactive (03/14 0955)  GBS: Negative (05/09 1054)  1 hr Glucola: normal Genetic screening:  NIPS normal Anatomy US: normal  Prenatal Transfer Tool  Maternal Diabetes: No Genetic Screening: Normal Maternal Ultrasounds/Referrals: Normal Fetal Ultrasounds or other Referrals:  None Maternal Substance Abuse:   No Significant Maternal Medications:  None Significant Maternal Lab Results: Lab values include: Group B Strep negative  Results for orders placed or performed during the hospital encounter of 03/09/17 (from the past 24 hour(s))  CBC   Collection Time: 03/09/17  5:04 AM  Result Value Ref Range   WBC 10.6 (H) 4.0 - 10.5 K/uL   RBC 4.08 3.87 - 5.11 MIL/uL   Hemoglobin 12.5 12.0 - 15.0 g/dL   HCT 16.1 09.6 - 04.5 %   MCV 90.2 78.0 - 100.0 fL   MCH 30.6 26.0 - 34.0 pg   MCHC 34.0 30.0 - 36.0 g/dL   RDW 40.9 81.1 - 91.4 %   Platelets 189 150 - 400 K/uL    Patient Active Problem List   Diagnosis Date Noted  . Normal labor 03/09/2017  . Low vitamin D level 03/02/2017  . Supervision of other normal pregnancy, antepartum 08/18/2016  . 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 Tobacco dependence 07/01/2015  . Pain aggravated by physical activity 03/07/2013  . Backache 03/07/2013  . Low backache 02/07/2013  . Deliberate self-cutting 10/12/2011    Assessment: ANDERSON COPPOCK is a 26 y.o. G3P2002 at [redacted]w[redacted]d here for SOL  #Labor: Doing well expectant mangment #Pain: epdirual in place #FWB: Category 1 #ID:  GBS negative #MOF: breast #MOC:considering BTL #Circ:  N/A  Ernestina Penna 03/09/2017, 6:40 AM

## 2017-03-09 NOTE — Lactation Note (Signed)
This note was copied from a baby's chart. Lactation Consultation Note  Patient Name: Jeanette Darreld McleanRochelle Renfroe WUJWJ'XToday's Date: 03/09/2017 Reason for consult: Initial assessment  3rd baby (BF first baby for 2 weeks), wants to breastfeed longer with this baby. 9 hrs old.  Mom awake, family member holding baby. Offered assist as baby started cueing, basic teaching.  Hand expression taught, colostrum easily expressed Baby latched in football hold after a few attempts, on left breast.  Teaching on use of breast support and sandwiching breast for baby's deep latch Swallows identified for Mom.   Encouraged exclusive breastfeeding, keeping baby STS, and feeding often with early cues.  Goal is 8-12 feedings per 24 hrs. Mom to call for assistance as needed. Brochure given with information on IP and OP lactation services available.   To follow up in am.  Consult Status Consult Status: Follow-up Date: 03/10/17 Follow-up type: In-patient    [name], [name] 03/09/2017, 4:31 PM

## 2017-03-10 ENCOUNTER — Encounter: Payer: Medicaid Other | Admitting: Obstetrics

## 2017-03-10 LAB — RPR: RPR Ser Ql: NONREACTIVE

## 2017-03-10 MED ORDER — DOCUSATE SODIUM 100 MG PO CAPS: 100.0000 mg | ORAL_CAPSULE | Freq: Two times a day (BID) | ORAL | 0 refills | Status: DC

## 2017-03-10 MED ORDER — IBUPROFEN 600 MG PO TABS: 600.0000 mg | ORAL_TABLET | Freq: Four times a day (QID) | ORAL | 0 refills | Status: DC

## 2017-03-10 NOTE — Discharge Summary (Signed)
OB Discharge Summary     Patient Name: [name] DOB: January 31, 1991 MRN: [id number]  Date of admission: 03/09/2017 Delivering MD: Lorne SkeensSCHENK, NICHOLAS MICHAEL   Date of discharge: 03/10/2017  Admitting diagnosis: 38 WEEKS CTX Intrauterine pregnancy: 4815w1d     Secondary diagnosis:  Active Problems:   Normal labor   NSVD (normal spontaneous vaginal delivery)  Additional problems: None     Discharge diagnosis: Term Pregnancy Delivered                                                                                                Post partum procedures:None  Augmentation: AROM  Complications: None  Hospital course:  Onset of Labor With Vaginal Delivery     26 y.o. yo [id number] at 5715w1d was admitted in Active Labor on 03/09/2017. Patient had an uncomplicated labor course as follows:  Membrane Rupture Time/Date: 6:38 AM ,03/09/2017   Intrapartum Procedures: Episiotomy: None [1]                                         Lacerations:  Labial [10]  Patient had a delivery of a Viable infant. 03/09/2017  Information for the patient's newborn:  Reola MosherFields, Girl Adalida [[id number]][[id number]]  Delivery Method: Vaginal, Spontaneous Delivery (Filed from Delivery Summary)    Pateint had an uncomplicated postpartum course.  She is ambulating, tolerating a regular diet, passing flatus, and urinating well. Patient is discharged home in stable condition on 03/10/17.   Physical exam  Vitals:   03/09/17 1356 03/09/17 1722 03/09/17 2045 03/10/17 0532  BP: 116/66 128/69 119/74 119/83  Pulse: 62 72 70 64  Resp: 18 18 18 18   Temp: 98.5 F (36.9 C) 98.4 F (36.9 C) 98.4 F (36.9 C) 98 F (36.7 C)  TempSrc: Oral Oral Oral Oral  SpO2: 98%  99%   Weight:      Height:       General: alert, cooperative and no distress Lochia: appropriate Uterine Fundus: firm Incision: N/A DVT Evaluation: No evidence of DVT seen on physical exam. Negative Homan's sign. No cords or calf tenderness. Labs: Lab Results   Component Value Date   WBC 10.6 (H) 03/09/2017   HGB 12.5 03/09/2017   HCT 36.8 03/09/2017   MCV 90.2 03/09/2017   PLT 189 03/09/2017   CMP Latest Ref Rng & Units 03/02/2011  Glucose 70 - 99 mg/dL 93  BUN 6 - 23 mg/dL 6  Creatinine 0.4 - 1.2 mg/dL 1.300.73  Sodium 865135 - 784145 mEq/L 134(L)  Potassium 3.5 - 5.1 mEq/L 4.0  Chloride 96 - 112 mEq/L 99  CO2 19 - 32 mEq/L 21  Calcium 8.4 - 10.5 mg/dL 9.8  Total Protein 6.0 - 8.3 g/dL 7.4  Total Bilirubin 0.3 - 1.2 mg/dL 0.4  Alkaline Phos 39 - 117 U/L 38(L)  AST 0 - 37 U/L 16  ALT 0 - 35 U/L 14    Discharge instruction: per After Visit Summary and "Baby and Me Booklet".  After visit meds:  Allergies as of 03/10/2017   No Known Allergies     Medication List    STOP taking these medications   Doxylamine-Pyridoxine 10-10 MG Tbec Commonly known as:  DICLEGIS   ondansetron 8 MG tablet Commonly known as:  ZOFRAN     TAKE these medications   docusate sodium 100 MG capsule Commonly known as:  COLACE Take 1 capsule (100 mg total) by mouth 2 (two) times daily.   ibuprofen 600 MG tablet Commonly known as:  ADVIL,MOTRIN Take 1 tablet (600 mg total) by mouth every 6 (six) hours.   omeprazole 20 MG capsule Commonly known as:  PRILOSEC Take 1 capsule (20 mg total) by mouth 2 (two) times daily before a meal.   VITAFOL-ONE 29-1-200 MG Caps Take 1 capsule by mouth daily before breakfast.   Vitamin D (Ergocalciferol) 50000 units Caps capsule Commonly known as:  DRISDOL Take 1 capsule (50,000 Units total) by mouth every 7 (seven) days.       Diet: routine diet  Activity: Advance as tolerated. Pelvic rest for 6 weeks.   Outpatient follow up:6 weeks Follow up Appt:No future appointments. Follow up Visit:No Follow-up on file.  Postpartum contraception: IUD Mirena  Newborn Data: Live born female  Birth Weight: 6 lb 10.9 oz (3030 g) APGAR: 9, 9  Baby Feeding: Breast Disposition:home with mother   03/10/2017 Jen Mow, DO OB Fellow

## 2017-03-10 NOTE — Progress Notes (Signed)
Post Partum Day 1  Subjective:  [name] is a 26 y.o. [id number] 7572w1d s/p NSVD.  No acute events overnight.  Pt denies problems with ambulating, voiding or po intake.  She reports having nausea yesterday night that resolved with receiving motrin, but denies vomiting.  Pain is well controlled.  She has had flatus. She has had bowel movement.  Lochia Small.   Method of Feeding: breast feeding  Objective: BP 119/83 (BP Location: Right Arm)   Pulse 64   Temp 98 F (36.7 C) (Oral)   Resp 18   Ht 5' 5.5" (1.664 m)   Wt 90.3 kg (199 lb)   LMP 06/15/2016 (Approximate)   SpO2 99%   Breastfeeding? Unknown   BMI 32.61 kg/m   Physical Exam:  General: alert, cooperative and no distress Chest: normal work of breathing Abdomen: soft, nontender Uterine Fundus: fundus firm at umbilicus DVT Evaluation: No evidence of DVT seen on physical exam. Extremities: no LE edema   Recent Labs  03/09/17 0504  HGB 12.5  HCT 36.8    Assessment/Plan:  ASSESSMENT: [name] is a 26 y.o. G3P3003 2172w1d ppd #1 s/p NSVD doing well.   Continue current care Possible discharge today   LOS: 1 day   Ardyth HarpsJohn Adeolu Keku Medical Student 03/10/2017, 7:27 AM    OB FELLOW POSTPARTUM PROGRESS NOTE ATTESTATION  I have seen and examined this patient and agree with above documentation in the student's note. Patient is ambulating well, mild lochia, urinating without difficulty. Baby has to stay overnight, plan to discharge home tomorrow. Discussed birth control methods with patient at length, she was debating on a BTL, however she did not feel 100% certain she wants a permanent method, discussed IUD, and patient really likes idea of Mirena and plans on this form for birth control. Breastfeeding.  Jen MowElizabeth Mumaw, DO OB Fellow

## 2017-03-10 NOTE — Lactation Note (Signed)
This note was copied from a baby's chart. Lactation Consultation Note  Patient Name: Jeanette Darreld McleanRochelle Maynez ZOXWR'UToday's Date: 03/10/2017 Reason for consult: Follow-up assessment Baby at 28 hr of life. Upon entry baby was coming off the breast. Mom reports baby is latching well. She denies breast or nipple pain. She is concerned that she does not have a pump for home use. Given Harmony and instructed to f/u with WIC. Discussed baby behavior, feeding frequency, baby belly size, voids, wt loss, breast changes, and nipple care. Mom stated she can manually express and has a spoon in the room. She is aware of lactation services and support group. She will offer the breast on demand 8+/24hr, post express as needed, and offer expressed milk as needed per volume guidelines.     Maternal Data    Feeding Feeding Type: Breast Fed Length of feed: 10 min  LATCH Score/Interventions Latch: Grasps breast easily, tongue down, lips flanged, rhythmical sucking. (per mom)  Audible Swallowing: A few with stimulation (per mom)  Type of Nipple: Everted at rest and after stimulation  Comfort (Breast/Nipple): Soft / non-tender     Hold (Positioning): No assistance needed to correctly position infant at breast. (per mom)  LATCH Score: 9  Lactation Tools Discussed/Used WIC Program: Yes Pump Review: Setup, frequency, and cleaning;Milk Storage Initiated by:: ES Date initiated:: 03/10/17   Consult Status Consult Status: Follow-up Date: 03/11/17 Follow-up type: In-patient    Rulon Eisenmengerlizabeth E Beva Remund 03/10/2017, 12:06 PM

## 2017-03-10 NOTE — Discharge Instructions (Signed)
Vaginal Delivery, Care After °Refer to this sheet in the next few weeks. These instructions provide you with information about caring for yourself after vaginal delivery. Your health care provider may also give you more specific instructions. Your treatment has been planned according to current medical practices, but problems sometimes occur. Call your health care provider if you have any problems or questions. °What can I expect after the procedure? °After vaginal delivery, it is common to have: °· Some bleeding from your vagina. °· Soreness in your abdomen, your vagina, and the area of skin between your vaginal opening and your anus (perineum). °· Pelvic cramps. °· Fatigue. °Follow these instructions at home: °Medicines  °· Take over-the-counter and prescription medicines only as told by your health care provider. °· If you were prescribed an antibiotic medicine, take it as told by your health care provider. Do not stop taking the antibiotic until it is finished. °Driving  ° °· Do not drive or operate heavy machinery while taking prescription pain medicine. °· Do not drive for 24 hours if you received a sedative. °Lifestyle  °· Do not drink alcohol. This is especially important if you are breastfeeding or taking medicine to relieve pain. °· Do not use tobacco products, including cigarettes, chewing tobacco, or e-cigarettes. If you need help quitting, ask your health care provider. °Eating and drinking  °· Drink at least 8 eight-ounce glasses of water every day unless you are told not to by your health care provider. If you choose to breastfeed your baby, you may need to drink more water than this. °· Eat high-fiber foods every day. These foods may help prevent or relieve constipation. High-fiber foods include: °¨ Whole grain cereals and breads. °¨ Brown rice. °¨ Beans. °¨ Fresh fruits and vegetables. °Activity  °· Return to your normal activities as told by your health care provider. Ask your health care provider  what activities are safe for you. °· Rest as much as possible. Try to rest or take a nap when your baby is sleeping. °· Do not lift anything that is heavier than your baby or 10 lb (4.5 kg) until your health care provider says that it is safe. °· Talk with your health care provider about when you can engage in sexual activity. This may depend on your: °¨ Risk of infection. °¨ Rate of healing. °¨ Comfort and desire to engage in sexual activity. °Vaginal Care  °· If you have an episiotomy or a vaginal tear, check the area every day for signs of infection. Check for: °¨ More redness, swelling, or pain. °¨ More fluid or blood. °¨ Warmth. °¨ Pus or a bad smell. °· Do not use tampons or douches until your health care provider says this is safe. °· Watch for any blood clots that may pass from your vagina. These may look like clumps of dark red, brown, or black discharge. °General instructions  °· Keep your perineum clean and dry as told by your health care provider. °· Wear loose, comfortable clothing. °· Wipe from front to back when you use the toilet. °· Ask your health care provider if you can shower or take a bath. If you had an episiotomy or a perineal tear during labor and delivery, your health care provider may tell you not to take baths for a certain length of time. °· Wear a bra that supports your breasts and fits you well. °· If possible, have someone help you with household activities and help care for your baby for   at least a few days after you leave the hospital. °· Keep all follow-up visits for you and your baby as told by your health care provider. This is important. °Contact a health care provider if: °· You have: °¨ Vaginal discharge that has a bad smell. °¨ Difficulty urinating. °¨ Pain when urinating. °¨ A sudden increase or decrease in the frequency of your bowel movements. °¨ More redness, swelling, or pain around your episiotomy or vaginal tear. °¨ More fluid or blood coming from your episiotomy or  vaginal tear. °¨ Pus or a bad smell coming from your episiotomy or vaginal tear. °¨ A fever. °¨ A rash. °¨ Little or no interest in activities you used to enjoy. °¨ Questions about caring for yourself or your baby. °· Your episiotomy or vaginal tear feels warm to the touch. °· Your episiotomy or vaginal tear is separating or does not appear to be healing. °· Your breasts are painful, hard, or turn red. °· You feel unusually sad or worried. °· You feel nauseous or you vomit. °· You pass large blood clots from your vagina. If you pass a blood clot from your vagina, save it to show to your health care provider. Do not flush blood clots down the toilet without having your health care provider look at them. °· You urinate more than usual. °· You are dizzy or light-headed. °· You have not breastfed at all and you have not had a menstrual period for 12 weeks after delivery. °· You have stopped breastfeeding and you have not had a menstrual period for 12 weeks after you stopped breastfeeding. °Get help right away if: °· You have: °¨ Pain that does not go away or does not get better with medicine. °¨ Chest pain. °¨ Difficulty breathing. °¨ Blurred vision or spots in your vision. °¨ Thoughts about hurting yourself or your baby. °· You develop pain in your abdomen or in one of your legs. °· You develop a severe headache. °· You faint. °· You bleed from your vagina so much that you fill two sanitary pads in one hour. °This information is not intended to replace advice given to you by your health care provider. Make sure you discuss any questions you have with your health care provider. °Document Released: 09/25/2000 Document Revised: 03/11/2016 Document Reviewed: 10/13/2015 °Elsevier Interactive Patient Education © 2017 [employer] ° ° °Intrauterine Device Information °An intrauterine device (IUD) is inserted into your uterus to prevent pregnancy. There are two types of IUDs available: °· Copper IUD--This type of IUD is  wrapped in copper wire and is placed inside the uterus. Copper makes the uterus and fallopian tubes produce a fluid that kills sperm. The copper IUD can stay in place for 10 years. °· Hormone IUD--This type of IUD contains the hormone progestin (synthetic progesterone). The hormone thickens the cervical mucus and prevents sperm from entering the uterus. It also thins the uterine lining to prevent implantation of a fertilized egg. The hormone can weaken or kill the sperm that get into the uterus. One type of hormone IUD can stay in place for 5 years, and another type can stay in place for 3 years. °Your health care provider will make sure you are a good candidate for a contraceptive IUD. Discuss with your health care provider the possible side effects. °Advantages of an intrauterine device °· IUDs are highly effective, reversible, long acting, and low maintenance. °· There are no estrogen-related side effects. °· An IUD can be used when breastfeeding. °·   IUDs are not associated with weight gain. °· The copper IUD works immediately after insertion. °· The hormone IUD works right away if inserted within 7 days of your period starting. You will need to use a backup method of birth control for 7 days if the hormone IUD is inserted at any other time in your cycle. °· The copper IUD does not interfere with your female hormones. °· The hormone IUD can make heavy menstrual periods lighter and decrease cramping. °· The hormone IUD can be used for 3 or 5 years. °· The copper IUD can be used for 10 years. °Disadvantages of an intrauterine device °· The hormone IUD can be associated with irregular bleeding patterns. °· The copper IUD can make your menstrual flow heavier and more painful. °· You may experience cramping and vaginal bleeding after insertion. °This information is not intended to replace advice given to you by your health care provider. Make sure you discuss any questions you have with your health care  provider. °Document Released: 09/01/2004 Document Revised: 03/05/2016 Document Reviewed: 03/19/2013 °Elsevier Interactive Patient Education © 2017 Else[employer] Inc. ° °

## 2017-03-10 NOTE — Progress Notes (Signed)
MOB was referred for history of depression/anxiety. * Referral screened out by Clinical Social Worker because none of the following criteria appear to apply: ~ History of anxiety/depression during this pregnancy, or of post-partum depression. ~ Diagnosis of anxiety and/or depression within last 3 years OR * MOB's symptoms currently being treated with medication and/or therapy. Please contact the Clinical Social Worker if needs arise, or if MOB requests.  CSW reviewed medical record and notes that MOB has hx of cutting, anx/dep in 2012, which was addressed by CSW in 2013 when MOB had her last baby.  No current concerns documented.  CSW notes hx of marijuana use with positive screen at initial visit.  UDS not completed.  CSW notified CN Charge RN to request that CDS be ordered.  CSW will monitor results and make report to Child Protective Services if warranted.

## 2017-04-06 ENCOUNTER — Ambulatory Visit (INDEPENDENT_AMBULATORY_CARE_PROVIDER_SITE_OTHER): Payer: Medicaid Other | Admitting: Certified Nurse Midwife

## 2017-04-06 ENCOUNTER — Encounter: Payer: Self-pay | Admitting: Certified Nurse Midwife

## 2017-04-06 DIAGNOSIS — Z3009 Encounter for other general counseling and advice on contraception: Secondary | ICD-10-CM

## 2017-04-06 NOTE — Progress Notes (Signed)
Post Partum Exam  [name] is a 26 y.o. [phone] female who presents for a postpartum visit. She is 4 weeks postpartum following a spontaneous vaginal delivery. I have fully reviewed the prenatal and intrapartum course. The delivery was at 38 gestational weeks.  Anesthesia: epidural. Postpartum course has been . Baby's course has been normal. Baby is feeding by both breast and bottle - Similac Advance. Bleeding thin lochia. Bowel function is normal. Bladder function is normal. Patient is sexually active. Contraception method is none. Postpartum depression screening:neg.  Had unprotected sexual intercourse last night.  Desires to return to work.  Contraception counseling given, wants IUD.   The following portions of the patient's history were reviewed and updated as appropriate: allergies, current medications, past family history, past medical history, past social history, past surgical history and problem list.  Review of Systems Pertinent items noted in HPI and remainder of comprehensive ROS otherwise negative.    Objective:  unknown if currently breastfeeding.  General:  alert, cooperative and no distress   Breasts:  inspection negative, no nipple discharge or bleeding, no masses or nodularity palpable  Lungs: clear to auscultation bilaterally  Heart:  regular rate and rhythm, S1, S2 normal, no murmur, click, rub or gallop  Abdomen: soft, non-tender; bowel sounds normal; no masses,  no organomegaly  Pelvic Exam: Not performed.        Assessment:    Normal 4 week postpartum exam. Pap smear not done at today's visit.   Contraception counseling.    Plan:   1. Contraception: none 2.  IUD insertion in 2 weeks.   Pap 08/18/16: normal.  3. Follow up in: 2 weeks or as needed.

## 2017-04-23 ENCOUNTER — Ambulatory Visit (INDEPENDENT_AMBULATORY_CARE_PROVIDER_SITE_OTHER): Payer: Medicaid Other | Admitting: Certified Nurse Midwife

## 2017-04-23 ENCOUNTER — Encounter: Payer: Self-pay | Admitting: Certified Nurse Midwife

## 2017-04-23 VITALS — BP 109/72 | HR 71 | Wt 182.8 lb

## 2017-04-23 DIAGNOSIS — Z30430 Encounter for insertion of intrauterine contraceptive device: Secondary | ICD-10-CM | POA: Diagnosis not present

## 2017-04-23 DIAGNOSIS — Z30014 Encounter for initial prescription of intrauterine contraceptive device: Secondary | ICD-10-CM

## 2017-04-23 MED ORDER — LEVONORGESTREL 20 MCG/24HR IU IUD
INTRAUTERINE_SYSTEM | Freq: Once | INTRAUTERINE | Status: AC
  Administered 2017-04-23: 09:00:00 via INTRAUTERINE

## 2017-04-23 NOTE — Progress Notes (Signed)
Patient is in the office for IUD insertion. Patient wishes to get the Mirena. Patient is not sexually active. Patient is 6 weeks out from her delivery and she reports she is doing well.

## 2017-04-23 NOTE — Progress Notes (Signed)
IUD Procedure Note   DIAGNOSIS: Desires long-term, reversible contraception   PROCEDURE: IUD placement Performing Provider: Orvilla Cornwallachelle Demond Shallenberger CNM  Patient counseled prior to procedure. I explained risks and benefits of Mirena IUD, reviewed alternative forms of contraception. Patient stated understanding and consented to continue with procedure.   LMP: unknown, postpartum Pregnancy Test: Negative Lot: [id number]   Exp: 11/20  IUD type: [X]  Mirena   [  ] Paragard  [  ] Candise CheLyletta   [  ]  Kyleena  PROCEDURE:  Timeout procedure was performed to ensure right patient and right site.  A bimanual exam was performed to determine the position of the uterus, retroverted. The speculum was placed. The vagina and cervix was sterilized in the usual manner and sterile technique was maintained throughout the course of the procedure. A single toothed tenaculum was not required. The depth of the uterus was sounded to 12 cm. The IUD was inserted to the appropriate depth and inserted without difficulty.  The string was cut to an estimated 4 cm length. Bleeding was minimal. The patient tolerated the procedure well.   Follow up: The patient tolerated the procedure well without complications.  Standard post-procedure care is explained and return precautions are given.  Orvilla Cornwallachelle Cletis Clack CNM

## 2017-05-04 ENCOUNTER — Encounter: Payer: Self-pay | Admitting: *Deleted

## 2017-05-24 ENCOUNTER — Ambulatory Visit: Payer: Medicaid Other | Admitting: Certified Nurse Midwife

## 2018-02-02 DIAGNOSIS — F32A Depression, unspecified: Secondary | ICD-10-CM | POA: Diagnosis present

## 2018-02-02 DIAGNOSIS — F329 Major depressive disorder, single episode, unspecified: Secondary | ICD-10-CM | POA: Diagnosis present

## 2018-02-08 ENCOUNTER — Encounter (HOSPITAL_COMMUNITY): Payer: Self-pay

## 2018-02-08 ENCOUNTER — Emergency Department (HOSPITAL_COMMUNITY)
Admission: EM | Admit: 2018-02-08 | Discharge: 2018-02-08 | Disposition: A | Discharge status: Home / Self Care | Payer: Medicaid Other | Attending: Emergency Medicine | Admitting: Emergency Medicine

## 2018-02-08 ENCOUNTER — Other Ambulatory Visit: Payer: Self-pay

## 2018-02-08 DIAGNOSIS — J020 Streptococcal pharyngitis: Secondary | ICD-10-CM | POA: Diagnosis not present

## 2018-02-08 DIAGNOSIS — Z79899 Other long term (current) drug therapy: Secondary | ICD-10-CM | POA: Diagnosis not present

## 2018-02-08 DIAGNOSIS — R070 Pain in throat: Secondary | ICD-10-CM | POA: Diagnosis present

## 2018-02-08 DIAGNOSIS — Z87891 Personal history of nicotine dependence: Secondary | ICD-10-CM | POA: Insufficient documentation

## 2018-02-08 LAB — GROUP A STREP BY PCR: Group A Strep by PCR: DETECTED — AB

## 2018-02-08 MED ORDER — PENICILLIN G BENZATHINE & PROC 1200000 UNIT/2ML IM SUSP
2.4000 10*6.[IU] | Freq: Once | INTRAMUSCULAR | Status: AC
  Administered 2018-02-08: 2.4 10*6.[IU] via INTRAMUSCULAR
  Filled 2018-02-08: qty 4

## 2018-02-08 MED ORDER — BENZONATATE 100 MG PO CAPS: 100.0000 mg | ORAL_CAPSULE | Freq: Three times a day (TID) | ORAL | 0 refills | Status: DC

## 2018-02-08 MED ORDER — FLUTICASONE PROPIONATE 50 MCG/ACT NA SUSP: 1.0000 | Freq: Every day | NASAL | 0 refills | Status: DC

## 2018-02-08 NOTE — ED Provider Notes (Signed)
[facility] EMERGENCY DEPARTMENT Provider Note   CSN: [id number] Arrival date & time: 02/08/18  4098     History   Chief Complaint Chief Complaint  Patient presents with  . Sore Throat    HPI [name] is a 27 y.o. female presenting for evaluation of sore throat.  Patient states 2 days ago, she started to develop a sore throat.  This worsened yesterday.  She reports subjective fevers last night.  She has associated nasal congestion and productive cough.  She denies ear pain, eye pain, difficulty breathing, difficulty handling secretions, chest pain, nausea, vomiting, abdominal pain, urinary symptoms, normal bowel movements.  Daughter recently sick with pneumonia, and uncle with ear infection.  Patient denies other medical problems, states he does not take medications daily.  She has not taken anything for her symptoms.  HPI  Past Medical History:  Diagnosis Date  . Depression   . Depression   . Mental disorder    depression  . Sickle cell trait [facility])     Patient Active Problem List   Diagnosis Date Noted  . Depression   . Encounter for IUD insertion 04/23/2017  . Low vitamin D level 03/02/2017  . 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 Tobacco dependence 07/01/2015  . Pain aggravated by physical activity 03/07/2013  . Backache 03/07/2013  . Low backache 02/07/2013  . Deliberate self-cutting 10/12/2011    Past Surgical History:  Procedure Laterality Date  . NO PAST SURGERIES    . WISDOM TOOTH EXTRACTION       OB History    Gravida  3   Para  3   Term  3   Preterm  0   AB  0   Living  3     SAB  0   TAB  0   Ectopic  0   Multiple  0   Live Births  3            Home Medications    Prior to Admission medications   Medication Sig Start Date End Date Taking? Authorizing Provider  levonorgestrel (MIRENA) 20 MCG/24HR IUD 1 each by Intrauterine route once.   Yes  [provider]  benzonatate (TESSALON) 100 MG capsule Take 1 capsule (100 mg total) by mouth every 8 (eight) hours. 02/08/18   Katerra Ingman, PA-C  fluticasone (FLONASE) 50 MCG/ACT nasal spray Place 1 spray into both nostrils daily. 02/08/18   Deidra Spease, PA-C  ibuprofen (ADVIL,MOTRIN) 600 MG tablet Take 1 tablet (600 mg total) by mouth every 6 (six) hours. Patient not taking: Reported on 02/08/2018 03/10/17   Mumaw, Hiram Comber, DO  Prenatal Vit-FePoly-FA-DHA (VITAFOL-ONE) 29-1-200 MG CAPS Take 1 capsule by mouth daily before breakfast. Patient not taking: Reported on 02/08/2018 01/22/16   Brock Bad, MD  Vitamin D, Ergocalciferol, (DRISDOL) 50000 units CAPS capsule Take 1 capsule (50,000 Units total) by mouth every 7 (seven) days. Patient not taking: Reported on 02/08/2018 03/02/17   Roe Coombs, CNM    Family History Family History  Problem Relation Age of Onset  . Bipolar disorder Mother   . Schizophrenia Mother   . Anesthesia problems Neg Hx   . Hypotension Neg Hx   . Malignant hyperthermia Neg Hx   . Pseudochol deficiency Neg Hx     Social History Social History   Tobacco Use  . Smoking status: Former Smoker    Packs/day: 0.05  Years: 6.00    Pack years: 0.30    Types: Cigarettes  . Smokeless tobacco: Never Used  . Tobacco comment: stopped at beginning of pregnancy  Substance Use Topics  . Alcohol use: No    Alcohol/week: 0.0 oz  . Drug use: No    Types: Other-see comments, Marijuana    Comment: positive marijuana per UDS this admission, 10/12/11     Allergies   Patient has no known allergies.   Review of Systems Review of Systems  Constitutional: Positive for fever (subjective).  HENT: Positive for congestion, postnasal drip, rhinorrhea and sore throat. Negative for trouble swallowing and voice change.   Respiratory: Positive for cough. Negative for chest tightness and shortness of breath.   Cardiovascular: Negative for  chest pain.     Physical Exam Updated Vital Signs BP 134/86 (BP Location: Right Arm)   Pulse 80   Temp 98.3 F (36.8 C) (Oral)   Resp 17   LMP 02/01/2018 (Approximate)   SpO2 96%   Physical Exam  Constitutional: She is oriented to person, place, and time. She appears well-developed and well-nourished. No distress.  HENT:  Head: Normocephalic and atraumatic.  Right Ear: Tympanic membrane, external ear and ear canal normal.  Left Ear: Tympanic membrane, external ear and ear canal normal.  Nose: Mucosal edema present. Right sinus exhibits no maxillary sinus tenderness and no frontal sinus tenderness. Left sinus exhibits no maxillary sinus tenderness and no frontal sinus tenderness.  Mouth/Throat: Uvula is midline and mucous membranes are normal. No trismus in the jaw. Posterior oropharyngeal erythema present. No oropharyngeal exudate or posterior oropharyngeal edema. Tonsils are 1+ on the right. Tonsils are 1+ on the left. No tonsillar exudate.  Nasal mucosal edema.  OP mildly erythematous without exudate or significant tonsillar swelling.  Handling secretions easily.  Uvula midline with equal palate rise.  No hot potato voice.  No trismus.  TMs nonerythematous and nonbulging bilaterally.  Eyes: Pupils are equal, round, and reactive to light. Conjunctivae and EOM are normal.  Neck: Normal range of motion.  Cardiovascular: Normal rate, regular rhythm and intact distal pulses.  Pulmonary/Chest: Effort normal and breath sounds normal. She has no decreased breath sounds. She has no wheezes. She has no rhonchi. She has no rales.  Pt speaking in full sentences without difficulty.  Clear lung sounds in all Ozment  Abdominal: Soft. She exhibits no distension. There is no tenderness.  Musculoskeletal: Normal range of motion.  Lymphadenopathy:    She has no cervical adenopathy.  Neurological: She is alert and oriented to person, place, and time.  Skin: Skin is warm.  Psychiatric: She has a  normal mood and affect.  Nursing note and vitals reviewed.    ED Treatments / Results  Labs (all labs ordered are listed, but only abnormal results are displayed) Labs Reviewed  GROUP A STREP BY PCR - Abnormal; Notable for the following components:      Result Value   Group A Strep by PCR DETECTED (*)    All other components within normal limits    EKG None  Radiology No results found.  Procedures Procedures (including critical care time)  Medications Ordered in ED Medications  penicillin g procaine-penicillin g benzathine (BICILLIN-CR) injection 600000-600000 units (has no administration in time range)     Initial Impression / Assessment and Plan / ED Course  I have reviewed the triage vital signs and the nursing notes.  Pertinent labs & imaging results that were available during my care of  the patient were reviewed by me and considered in my medical decision making (see chart for details).     Patient presenting with 2 day h/o ST.  Physical exam reassuring, patient is afebrile and appears nontoxic.  Pulmonary exam reassuring.  Doubt pneumonia, AOM, or peritonsillar abscess. Rapid strep positive.  Will give pen G shot today and treat symptomatically.  Patient to follow-up with primary care as needed.  At this time, patient appears safe for discharge.  Return precautions given.  Patient states she understands and agrees to plan.   Final Clinical Impressions(s) / ED Diagnoses   Final diagnoses:  Strep pharyngitis    ED Discharge Orders        Ordered    fluticasone (FLONASE) 50 MCG/ACT nasal spray  Daily     02/08/18 0953    benzonatate (TESSALON) 100 MG capsule  Every 8 hours     02/08/18 0953       Alveria Apley, PA-C 02/08/18 1009    Derwood Kaplan, MD 02/08/18 [phone]

## 2018-02-08 NOTE — Discharge Instructions (Signed)
You have been treated with an antibiotic shot.  You do not need further antibiotics at this time. You may continue to treat symptomatically.  Use Flonase for nasal congestion.  Use Tessalon Perles for cough.  Use Tylenol and ibuprofen as needed for fever or swelling. Make sure you are staying well-hydrated with water. Wash your hands frequently to prevent spread of infection. Follow up with your primary care doctor if your symptoms are not improving. Return to the emergency room if you develop worsening fevers, difficulty breathing, inability to swallow, or any new or concerning symptoms.

## 2018-02-08 NOTE — ED Triage Notes (Signed)
Pt presents for evaluation of sore throat and cough x 2 days. Reports children have been sick recently with PNA.

## 2018-02-08 NOTE — ED Notes (Signed)
Pt understands she needs to wait a least 15 mins after shot

## 2018-02-09 ENCOUNTER — Telehealth: Payer: Self-pay | Admitting: Emergency Medicine

## 2018-02-09 NOTE — Telephone Encounter (Signed)
Post ED Visit - Positive Culture Follow-up  Culture report reviewed by antimicrobial stewardship pharmacist:   Enzo Bi, Pharm.D.  Celedonio Miyamoto, Pharm.D., BCPS AQ-ID  Garvin Fila, Pharm.D., BCPS  Georgina Pillion, Pharm.D., BCPS  [place], [address].D., BCPS, AAHIVP  Estella Husk, Pharm.D., BCPS, AAHIVP  Lysle Pearl, PharmD, BCPS  Blake Divine, PharmD  Pollyann Samples, PharmD, BCPS  Positive strep culture Treated with PCN, organism sensitive to the same and no further patient follow-up is required at this time.  [name] 02/09/2018, 1:54 PM

## 2018-12-13 ENCOUNTER — Encounter (HOSPITAL_BASED_OUTPATIENT_CLINIC_OR_DEPARTMENT_OTHER): Payer: Self-pay

## 2018-12-13 ENCOUNTER — Other Ambulatory Visit: Payer: Self-pay

## 2018-12-13 ENCOUNTER — Emergency Department (HOSPITAL_BASED_OUTPATIENT_CLINIC_OR_DEPARTMENT_OTHER)
Admission: EM | Admit: 2018-12-13 | Discharge: 2018-12-13 | Disposition: A | Discharge status: Home / Self Care | Payer: Medicaid Other | Attending: Emergency Medicine | Admitting: Emergency Medicine

## 2018-12-13 DIAGNOSIS — Z79899 Other long term (current) drug therapy: Secondary | ICD-10-CM | POA: Diagnosis not present

## 2018-12-13 DIAGNOSIS — R2232 Localized swelling, mass and lump, left upper limb: Secondary | ICD-10-CM | POA: Diagnosis present

## 2018-12-13 DIAGNOSIS — Z87891 Personal history of nicotine dependence: Secondary | ICD-10-CM | POA: Insufficient documentation

## 2018-12-13 DIAGNOSIS — L03012 Cellulitis of left finger: Secondary | ICD-10-CM | POA: Diagnosis not present

## 2018-12-13 MED ORDER — LIDOCAINE HCL (PF) 1 % IJ SOLN
5.0000 mL | Freq: Once | INTRAMUSCULAR | Status: AC
  Administered 2018-12-13: 5 mL
  Filled 2018-12-13: qty 5

## 2018-12-13 NOTE — ED Triage Notes (Signed)
Pt c/o swelling redness to left middle finger x today-recent issue with "hang nail"-NAD-steady gait

## 2018-12-13 NOTE — ED Provider Notes (Signed)
[facility] EMERGENCY DEPARTMENT Provider Note   CSN: [id number] Arrival date & time: 12/13/18  1735    History   Chief Complaint Chief Complaint  Patient presents with  . Hand Pain    HPI [name] is a 28 y.o. female.     HPI   28 year old female presents with a 2-day history of redness to the left middle finger.  Patient states symptoms started with a "hangnail."  She then noted increased swelling and pain.  She feels like the pain is radiating up her left hand.  She denies any numbness, tingling, decreased range of motion.  She denies any fevers, chills.  Past Medical History:  Diagnosis Date  . Depression   . Depression   . Mental disorder    depression  . Sickle cell trait [facility])     Patient Active Problem List   Diagnosis Date Noted  . Depression   . Encounter for IUD insertion 04/23/2017  . Low vitamin D level 03/02/2017  . 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 Tobacco dependence 07/01/2015  . Pain aggravated by physical activity 03/07/2013  . Backache 03/07/2013  . Low backache 02/07/2013  . Deliberate self-cutting 10/12/2011    Past Surgical History:  Procedure Laterality Date  . NO PAST SURGERIES    . WISDOM TOOTH EXTRACTION       OB History    Gravida  3   Para  3   Term  3   Preterm  0   AB  0   Living  3     SAB  0   TAB  0   Ectopic  0   Multiple  0   Live Births  3            Home Medications    Prior to Admission medications   Medication Sig Start Date End Date Taking? Authorizing Provider  benzonatate (TESSALON) 100 MG capsule Take 1 capsule (100 mg total) by mouth every 8 (eight) hours. 02/08/18   Caccavale, Sophia, PA-C  fluticasone (FLONASE) 50 MCG/ACT nasal spray Place 1 spray into both nostrils daily. 02/08/18   Caccavale, Sophia, PA-C  ibuprofen (ADVIL,MOTRIN) 600 MG tablet Take 1 tablet (600 mg total) by mouth every 6 (six) hours. Patient not  taking: Reported on 02/08/2018 03/10/17   Mumaw, Hiram Comber, DO  levonorgestrel (MIRENA) 20 MCG/24HR IUD 1 each by Intrauterine route once.    [provider]  Prenatal Vit-FePoly-FA-DHA (VITAFOL-ONE) 29-1-200 MG CAPS Take 1 capsule by mouth daily before breakfast. Patient not taking: Reported on 02/08/2018 01/22/16   Brock Bad, MD  Vitamin D, Ergocalciferol, (DRISDOL) 50000 units CAPS capsule Take 1 capsule (50,000 Units total) by mouth every 7 (seven) days. Patient not taking: Reported on 02/08/2018 03/02/17   Roe Coombs, CNM    Family History Family History  Problem Relation Age of Onset  . Bipolar disorder Mother   . Schizophrenia Mother   . Anesthesia problems Neg Hx   . Hypotension Neg Hx   . Malignant hyperthermia Neg Hx   . Pseudochol deficiency Neg Hx     Social History Social History   Tobacco Use  . Smoking status: Former Smoker    Packs/day: 0.05    Years: 6.00    Pack years: 0.30    Types: Cigarettes  . Smokeless tobacco: Never Used  Substance Use Topics  . Alcohol use: No    Alcohol/week: 0.0 standard  drinks  . Drug use: Not Currently    Types: Other-see comments     Allergies   Patient has no known allergies.   Review of Systems Review of Systems  Constitutional: Negative for chills and fever.  Respiratory: Negative for shortness of breath.   Cardiovascular: Negative for chest pain.  Gastrointestinal: Negative for abdominal pain, nausea and vomiting.  Skin: Positive for wound (left middle finger).     Physical Exam Updated Vital Signs BP 124/77 (BP Location: Right Arm)   Pulse 70   Temp 98.2 F (36.8 C) (Oral)   Resp 18   Ht 5\' 5"  (1.651 m)   Wt 77.1 kg   LMP 11/27/2018   SpO2 100%   BMI 28.29 kg/m   Physical Exam Vitals signs and nursing note reviewed.  Constitutional:      Appearance: She is well-developed.  HENT:     Head: Normocephalic and atraumatic.  Eyes:     Conjunctiva/sclera: Conjunctivae  normal.  Neck:     Musculoskeletal: Neck supple.  Cardiovascular:     Rate and Rhythm: Normal rate and regular rhythm.     Heart sounds: Normal heart sounds. No murmur.  Pulmonary:     Effort: Pulmonary effort is normal. No respiratory distress.     Breath sounds: Normal breath sounds. No wheezing or rales.  Abdominal:     General: Bowel sounds are normal. There is no distension.     Palpations: Abdomen is soft.     Tenderness: There is no abdominal tenderness.  Musculoskeletal: Normal range of motion.        General: No tenderness or deformity.       Hands:  Skin:    General: Skin is warm and dry.     Findings: No erythema or rash.  Neurological:     Mental Status: She is alert and oriented to person, place, and time.  Psychiatric:        Behavior: Behavior normal.      ED Treatments / Results  Labs (all labs ordered are listed, but only abnormal results are displayed) Labs Reviewed - No data to display  EKG None  Radiology No results found.  Procedures .Marland KitchenIncision and Drainage Date/Time: 12/13/2018 7:00 PM Performed by: Clayborne Artist, PA-C Authorized by: Clayborne Artist, PA-C   Consent:    Consent obtained:  Verbal   Consent given by:  Patient   Risks discussed:  Bleeding, incomplete drainage, pain and damage to other organs   Alternatives discussed:  No treatment Universal protocol:    Procedure explained and questions answered to patient or proxy's satisfaction: yes     Relevant documents present and verified: yes     Test results available and properly labeled: yes     Imaging studies available: yes     Required blood products, implants, devices, and special equipment available: yes     Site/side marked: yes     Immediately prior to procedure a time out was called: yes     Patient identity confirmed:  Verbally with patient Location:    Type:  Abscess   Location:  Upper extremity   Upper extremity location:  Finger   Finger location:  L long  finger Pre-procedure details:    Skin preparation:  Betadine Anesthesia (see MAR for exact dosages):    Anesthesia method:  Local infiltration   Local anesthetic:  Lidocaine 1% w/o epi Procedure type:    Complexity:  Simple Procedure details:    Incision types:  Single straight   Incision depth:  Subcutaneous   Scalpel blade:  11   Wound management:  Probed and deloculated   Drainage:  Purulent   Drainage amount:  Moderate Post-procedure details:    Patient tolerance of procedure:  Tolerated well, no immediate complications   (including critical care time)  Medications Ordered in ED Medications  lidocaine (PF) (XYLOCAINE) 1 % injection 5 mL (has no administration in time range)     Initial Impression / Assessment and Plan / ED Course  I have reviewed the triage vital signs and the nursing notes.  Pertinent labs & imaging results that were available during my care of the patient were reviewed by me and considered in my medical decision making (see chart for details).        Patient presented with redness and swelling of the left middle finger.  History and physical consistent with onychia.  After discussion of risks and benefits alternatives, patient chose digital block before I&D.  Abscess was drained and patient has improvement in her symptoms.  Encouraged warm compresses and antibiotic ointment.  Do not feel that she needs p.o. antibiotics at this time as there is no evidence of cellulitis.  On physical exam no evidence of flexor tenosynovitis, no sausage digit, no pain with flexion.  Given strict return precautions.  Patient expressed understanding.  Patient ready and stable for discharge.    Final Clinical Impressions(s) / ED Diagnoses   Final diagnoses:  None    ED Discharge Orders    None       Rueben Bash 12/14/18 5732    Geoffery Lyons, MD 12/14/18 1505

## 2018-12-13 NOTE — Discharge Instructions (Signed)
Plan wound with water and soap.  Apply warm compresses to the wound for approximately 15 minutes 4-5 times a day.  Follow-up with your primary care provider in 2 days for wound recheck.  Return to the ED immediately for new or worsening symptoms or concerns, such as increased redness, increased swelling, fevers, chills or any concerns at all.

## 2019-02-07 ENCOUNTER — Encounter (HOSPITAL_COMMUNITY): Payer: Self-pay

## 2019-02-07 ENCOUNTER — Ambulatory Visit (HOSPITAL_COMMUNITY)
Admission: EM | Admit: 2019-02-07 | Discharge: 2019-02-07 | Disposition: A | Discharge status: Home / Self Care | Payer: Medicaid Other | Attending: Family Medicine | Admitting: Family Medicine

## 2019-02-07 ENCOUNTER — Other Ambulatory Visit: Payer: Self-pay

## 2019-02-07 DIAGNOSIS — M549 Dorsalgia, unspecified: Secondary | ICD-10-CM | POA: Diagnosis not present

## 2019-02-07 DIAGNOSIS — T148XXA Other injury of unspecified body region, initial encounter: Secondary | ICD-10-CM | POA: Diagnosis not present

## 2019-02-07 MED ORDER — KETOROLAC TROMETHAMINE 30 MG/ML IJ SOLN
30.0000 mg | Freq: Once | INTRAMUSCULAR | Status: AC
  Administered 2019-02-07: 16:00:00 30 mg via INTRAMUSCULAR

## 2019-02-07 MED ORDER — KETOROLAC TROMETHAMINE 30 MG/ML IJ SOLN
INTRAMUSCULAR | Status: AC
  Filled 2019-02-07: qty 1

## 2019-02-07 MED ORDER — CYCLOBENZAPRINE HCL 10 MG PO TABS: 10.0000 mg | ORAL_TABLET | Freq: Two times a day (BID) | ORAL | 0 refills | Status: DC | PRN
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
## 2019-03-23 ENCOUNTER — Other Ambulatory Visit: Payer: Self-pay

## 2019-03-23 ENCOUNTER — Emergency Department (HOSPITAL_COMMUNITY)
Admission: EM | Admit: 2019-03-23 | Discharge: 2019-03-23 | Disposition: A | Discharge status: Home / Self Care | Payer: Medicaid Other | Attending: Emergency Medicine | Admitting: Emergency Medicine

## 2019-03-23 DIAGNOSIS — D573 Sickle-cell trait: Secondary | ICD-10-CM | POA: Insufficient documentation

## 2019-03-23 DIAGNOSIS — Z87891 Personal history of nicotine dependence: Secondary | ICD-10-CM | POA: Diagnosis not present

## 2019-03-23 DIAGNOSIS — F1123 Opioid dependence with withdrawal: Secondary | ICD-10-CM | POA: Insufficient documentation

## 2019-03-23 DIAGNOSIS — R61 Generalized hyperhidrosis: Secondary | ICD-10-CM | POA: Insufficient documentation

## 2019-03-23 DIAGNOSIS — R6883 Chills (without fever): Secondary | ICD-10-CM | POA: Diagnosis not present

## 2019-03-23 DIAGNOSIS — R110 Nausea: Secondary | ICD-10-CM | POA: Diagnosis present

## 2019-03-23 DIAGNOSIS — F1193 Opioid use, unspecified with withdrawal: Secondary | ICD-10-CM

## 2019-03-23 MED ORDER — CLONIDINE HCL 0.1 MG PO TABS: 0.1000 mg | ORAL_TABLET | Freq: Three times a day (TID) | ORAL | 0 refills | Status: DC

## 2019-03-23 MED ORDER — CLONIDINE HCL 0.1 MG PO TABS
0.1000 mg | ORAL_TABLET | Freq: Once | ORAL | Status: AC
  Administered 2019-03-23: 0.1 mg via ORAL
  Filled 2019-03-23: qty 1

## 2019-03-23 MED ORDER — ONDANSETRON 4 MG PO TBDP: ORAL_TABLET | ORAL | 0 refills | Status: DC

## 2019-03-23 NOTE — ED Notes (Signed)
Got patient on the monitor did ekg shown to er doctor patient is resting with call bell in reach 

## 2019-03-23 NOTE — ED Provider Notes (Signed)
[facility] Provider Note   CSN: [id number] Arrival date & time: 03/23/19  1104     History   Chief Complaint Chief Complaint  Patient presents with  . Opioid Problem  . Chills  . Nausea    HPI [name] is a 28 y.o. female.     [name] is a 28 y.o. female history of depression and sickle cell trait, who presents to the emergency department with concern for opioid withdrawal.  Patient reports that she has been using Roxicodone, oxycodone and Xanax daily for the past 2 weeks and stopped using yesterday.  This morning she woke up with cold chills and sweats, she is also experiencing nausea but without any vomiting.  Received 4 mg Zofran with EMS.  She denies any pain she has not had any abdominal pain, diarrhea, melena or hematochezia.  No chest pain or shortness of breath.  No fevers or cough.  Denies any headaches, vision changes, numbness or weakness.  No seizures.  She denies taking these medications in an attempt to harm herself, denies any suicidal or homicidal thoughts.  She does express a desire to get help with this and reports she does not want to become dependent on these medications is concerned that she is starting to withdrawal she reports that the chills and cold sweats are extremely uncomfortable, Zofran with EMS helped with nausea.  Requesting outpatient resources for substance abuse. Denies associated alcohol use, denies other drug use.     Past Medical History:  Diagnosis Date  . Depression   . Depression   . Mental disorder    depression  . Sickle cell trait [facility](HCC)     Patient Active Problem List   Diagnosis Date Noted  . Depression   . Encounter for IUD insertion 04/23/2017  . Low vitamin D level 03/02/2017  . 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 Tobacco dependence 07/01/2015  . Pain aggravated by physical activity 03/07/2013  . Backache 03/07/2013   . Low backache 02/07/2013  . Deliberate self-cutting 10/12/2011    Past Surgical History:  Procedure Laterality Date  . NO PAST SURGERIES    . WISDOM TOOTH EXTRACTION       OB History    Gravida  3   Para  3   Term  3   Preterm  0   AB  0   Living  3     SAB  0   TAB  0   Ectopic  0   Multiple  0   Live Births  3            Home Medications    Prior to Admission medications   Medication Sig Start Date End Date Taking? Authorizing Provider  benzonatate (TESSALON) 100 MG capsule Take 1 capsule (100 mg total) by mouth every 8 (eight) hours. 02/08/18   Caccavale, Sophia, PA-C  cyclobenzaprine (FLEXERIL) 10 MG tablet Take 1 tablet (10 mg total) by mouth 2 (two) times daily as needed for muscle spasms. 02/07/19   Bast, Gloris Manchesterraci A, NP  fluticasone (FLONASE) 50 MCG/ACT nasal spray Place 1 spray into both nostrils daily. 02/08/18   Caccavale, Sophia, PA-C  ibuprofen (ADVIL,MOTRIN) 600 MG tablet Take 1 tablet (600 mg total) by mouth every 6 (six) hours. Patient not taking: Reported on 02/08/2018 03/10/17   Mumaw, Hiram ComberElizabeth Woodland, DO  levonorgestrel (MIRENA) 20 MCG/24HR IUD 1 each by Intrauterine route once.  [provider]  Prenatal Vit-FePoly-FA-DHA (VITAFOL-ONE) 29-1-200 MG CAPS Take 1 capsule by mouth daily before breakfast. Patient not taking: Reported on 02/08/2018 01/22/16   Shelly Bombard, MD  Vitamin D, Ergocalciferol, (DRISDOL) 50000 units CAPS capsule Take 1 capsule (50,000 Units total) by mouth every 7 (seven) days. Patient not taking: Reported on 02/08/2018 03/02/17   Morene Crocker, CNM    Family History Family History  Problem Relation Age of Onset  . Bipolar disorder Mother   . Schizophrenia Mother   . Anesthesia problems Neg Hx   . Hypotension Neg Hx   . Malignant hyperthermia Neg Hx   . Pseudochol deficiency Neg Hx     Social History Social History   Tobacco Use  . Smoking status: Former Smoker    Packs/day: 0.05    Years:  6.00    Pack years: 0.30    Types: Cigarettes  . Smokeless tobacco: Never Used  Substance Use Topics  . Alcohol use: No    Alcohol/week: 0.0 standard drinks  . Drug use: Not Currently    Types: Other-see comments     Allergies   Patient has no known allergies.   Review of Systems Review of Systems  Constitutional: Positive for chills, diaphoresis and fatigue. Negative for fever.  HENT: Negative.   Eyes: Negative for visual disturbance.  Respiratory: Negative for cough and shortness of breath.   Cardiovascular: Negative for chest pain.  Gastrointestinal: Positive for nausea. Negative for abdominal pain, blood in stool, constipation, diarrhea and vomiting.  Genitourinary: Negative for dysuria and frequency.  Musculoskeletal: Negative for arthralgias and myalgias.  Skin: Negative for color change and rash.  Neurological: Negative for dizziness, seizures, syncope and light-headedness.     Physical Exam Updated Vital Signs BP (!) 142/87 (BP Location: Right Arm)   Pulse 71   Temp 97.7 F (36.5 C) (Oral)   Resp 20   Ht 5' 5.5" (1.664 m)   Wt 74.8 kg   LMP 02/26/2019 (Exact Date)   SpO2 100%   BMI 27.04 kg/m   Physical Exam Vitals signs and nursing note reviewed.  Constitutional:      General: She is not in acute distress.    Appearance: Normal appearance. She is well-developed and normal weight. She is diaphoretic.     Comments: Patient appears diaphoretic but is resting comfortably and in no acute distress.  HENT:     Head: Normocephalic and atraumatic.     Mouth/Throat:     Mouth: Mucous membranes are moist.     Pharynx: Oropharynx is clear.  Eyes:     General:        Right eye: No discharge.        Left eye: No discharge.     Pupils: Pupils are equal, round, and reactive to light.  Neck:     Musculoskeletal: Neck supple.  Cardiovascular:     Rate and Rhythm: Normal rate and regular rhythm.     Heart sounds: Normal heart sounds. No murmur. No friction rub.  No gallop.   Pulmonary:     Effort: Pulmonary effort is normal. No respiratory distress.     Breath sounds: Normal breath sounds. No wheezing or rales.     Comments: Respirations equal and unlabored, patient able to speak in full sentences, lungs clear to auscultation bilaterally Abdominal:     General: Bowel sounds are normal. There is no distension.     Palpations: Abdomen is soft. There is no mass.  Tenderness: There is no abdominal tenderness. There is no guarding.     Comments: Abdomen soft, nondistended, nontender to palpation in all quadrants without guarding or peritoneal signs  Musculoskeletal:        General: No deformity.  Skin:    General: Skin is warm.     Capillary Refill: Capillary refill takes less than 2 seconds.  Neurological:     Mental Status: She is alert.     Coordination: Coordination normal.     Comments: Speech is clear, able to follow commands CN III-XII intact Normal strength in upper and lower extremities bilaterally including dorsiflexion and plantar flexion, strong and equal grip strength Sensation normal to light and sharp touch Moves extremities without ataxia, coordination intact  Psychiatric:        Attention and Perception: She does not perceive auditory or visual hallucinations.        Mood and Affect: Mood normal.        Behavior: Behavior normal.        Thought Content: Thought content does not include homicidal or suicidal ideation.      ED Treatments / Results  Labs (all labs ordered are listed, but only abnormal results are displayed) Labs Reviewed - No data to display  EKG EKG Interpretation  Date/Time:  Thursday March 23 2019 11:13:27 EDT Ventricular Rate:  70 PR Interval:    QRS Duration: 89 QT Interval:  428 QTC Calculation: 462 R Axis:   58 Text Interpretation:  Sinus rhythm Confirmed by Virgina NorfolkAdam, [place] [phone]) on 03/23/2019 11:14:40 AM   Radiology No results found.  Procedures Procedures (including critical care  time)  Medications Ordered in ED Medications  cloNIDine (CATAPRES) tablet 0.1 mg (has no administration in time range)     Initial Impression / Assessment and Plan / ED Course  I have reviewed the triage vital signs and the nursing notes.  Pertinent labs & imaging results that were available during my care of the patient were reviewed by me and considered in my medical decision making (see chart for details).  She presents with chills, diaphoresis and nausea after discontinuing use of opioids and Xanax, had been using daily for 2 weeks, no prior history of substance abuse or addiction.  She presents today with concern for withdrawal, patient expresses desire to get off of these medications and would like help with withdrawal symptoms and outpatient resources for substance abuse.  Cows score of 8 suggesting very mild withdrawal symptoms, will treat with Zofran and clonidine and provide outpatient resources, your support counselor consult placed as well.  Patient denies concomitant use of alcohol or other drugs.  I have discussed plan and return precautions with the patient and provided outpatient resources.  She expresses understanding and agreement with plan.  Discharged home in good condition.  Final Clinical Impressions(s) / ED Diagnoses   Final diagnoses:  Opioid withdrawal [facility](HCC)    ED Discharge Orders         Ordered    cloNIDine (CATAPRES) 0.1 MG tablet  3 times daily     03/23/19 1145    ondansetron (ZOFRAN ODT) 4 MG disintegrating tablet     03/23/19 1145           Dartha LodgeFord, Penny Arrambide N, [place]PA-C 03/23/19 1146    Virgina NorfolkCuratolo, Adam, DO 03/23/19 1153

## 2019-03-23 NOTE — Discharge Instructions (Signed)
Take clonidine 3 times daily for the next few days to help with chills, sweating and other withdrawal symptoms.  You can use Zofran to help with nausea and vomiting.  Please make sure you are drinking plenty of water.  Please use the substance abuse treatment resources provided to you today to help ensure that you are able to stop using these medications.

## 2019-03-23 NOTE — ED Triage Notes (Signed)
Pt here from home via EMS-for the last two weeks has been snorting roxi/oxi/xanax and stopped last night. This morning pt woke up cold, with chills and diaphoresis. Pt endorses nausea but no vomiting. Denies pain.

## 2019-04-28 ENCOUNTER — Encounter: Payer: Self-pay | Admitting: Family Medicine

## 2019-04-28 ENCOUNTER — Other Ambulatory Visit: Payer: Self-pay

## 2019-04-28 ENCOUNTER — Ambulatory Visit: Payer: Medicaid Other | Admitting: Family Medicine

## 2019-04-28 VITALS — BP 110/70 | HR 67 | Wt 155.0 lb

## 2019-04-28 DIAGNOSIS — M545 Low back pain: Secondary | ICD-10-CM

## 2019-04-28 DIAGNOSIS — Z0000 Encounter for general adult medical examination without abnormal findings: Secondary | ICD-10-CM | POA: Diagnosis not present

## 2019-04-28 DIAGNOSIS — G8929 Other chronic pain: Secondary | ICD-10-CM | POA: Diagnosis not present

## 2019-04-28 DIAGNOSIS — F419 Anxiety disorder, unspecified: Secondary | ICD-10-CM | POA: Diagnosis not present

## 2019-04-28 MED ORDER — METHOCARBAMOL 500 MG PO TABS: 500.0000 mg | ORAL_TABLET | Freq: Three times a day (TID) | ORAL | 3 refills | Status: DC | PRN

## 2019-04-28 MED ORDER — BUSPIRONE HCL 7.5 MG PO TABS: 7.5000 mg | ORAL_TABLET | Freq: Three times a day (TID) | ORAL | 1 refills | Status: DC

## 2019-04-28 NOTE — Patient Instructions (Signed)
It was nice meeting you today Ms. Dorantes!  We are starting buspirone 3 times daily for your anxiety and Robaxin up to 3 times daily for your muscle spasms.  Please try Robaxin when you are at home and not needing to drive or work since this can make you sleepy.  We will obtain an HIV and RPR testing today.  We will let you know what these results are when they return, likely early next week.  I would like to see you back in about 3 or 4 months to check on how you are doing and to get your Pap smear done.  You can always call or make an appointment for earlier if you need.  If you have any questions or concerns, please feel free to call the clinic.   Be well,  Dr. Shan Levans

## 2019-04-28 NOTE — Assessment & Plan Note (Signed)
Will prescribe Robaxin 500 mg 3 times daily PRN for back spasms.  Will stop Flexeril since this is not helpful for her.  If her pain continues, we will need to look into back exercises for possible rehabilitation.

## 2019-04-28 NOTE — Assessment & Plan Note (Signed)
HIV and RPR were collected today.  Patient will need to return in the fall for a Pap smear and replacement of her Mirena.

## 2019-04-28 NOTE — Assessment & Plan Note (Signed)
Appears to be significant according to patient history and GAD7.  I encouraged patient to follow-up with her mental health provider regarding this, but since she will not be able to see her for over a month, I will start buspirone 7.5 mg 3 times daily since this medication should not interfere with her Lexapro or Abilify.
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
## 2019-04-29 LAB — HIV ANTIBODY (ROUTINE TESTING W REFLEX): HIV Screen 4th Generation wRfx: NONREACTIVE

## 2019-04-29 LAB — RPR: RPR Ser Ql: NONREACTIVE

## 2019-05-03 ENCOUNTER — Telehealth: Payer: Self-pay | Admitting: *Deleted

## 2019-05-03 NOTE — Telephone Encounter (Signed)
-----   Message from Kathrene Alu, MD sent at 05/02/2019  2:48 PM EDT ----- Please let Ms. Bagg know that her HIV and RPR test results are normal.  Thanks.

## 2019-05-03 NOTE — Telephone Encounter (Signed)
Pt informed of below.[name], CMA ? ?

## 2019-06-06 ENCOUNTER — Ambulatory Visit: Payer: Medicaid Other | Admitting: Advanced Practice Midwife

## 2019-06-21 ENCOUNTER — Other Ambulatory Visit: Payer: Self-pay

## 2019-06-21 ENCOUNTER — Ambulatory Visit: Payer: Medicaid Other | Admitting: Family Medicine

## 2019-06-21 ENCOUNTER — Encounter: Payer: Self-pay | Admitting: Family Medicine

## 2019-06-21 VITALS — BP 127/80 | HR 72 | Ht 65.5 in | Wt 155.0 lb

## 2019-06-21 DIAGNOSIS — Z30432 Encounter for removal of intrauterine contraceptive device: Secondary | ICD-10-CM

## 2019-06-21 NOTE — Progress Notes (Signed)
    GYNECOLOGY OFFICE PROCEDURE NOTE  [name] is a 28 y.o. [phone] here for Mirena IUD removal. Device was inserted 04/23/2017. No GYN concerns.  Last pap smear was on 08/18/2016 and was normal.  Vitals:   06/21/19 1359  BP: 127/80  Pulse: 72  Weight: 155 lb (70.3 kg)  Height: 5' 5.5" (1.664 m)    IUD Removal  Patient identified, informed consent performed, consent signed.  Patient was in the dorsal lithotomy position, normal external genitalia was noted.  A speculum was placed in the patient's vagina, normal discharge was noted, no lesions. The cervix was visualized, no lesions, no abnormal discharge.  The strings of the IUD were grasped and pulled using ring forceps. The IUD was removed in its entirety.  Patient tolerated the procedure well.    Patient desires another pregnancy. She was told to avoid teratogens, take PNV and folic acid.  Routine preventative health maintenance measures emphasized. She plans to return either for prenatal care or in next 3-6 months for annual physical and Pap.  Barrington Ellison, MD [facility] Family Medicine Fellow, [facility] for [employer], [place]

## 2019-06-21 NOTE — Progress Notes (Signed)
Patient is in the office for IUD removal, inserted 04-23-17. Last pap 08-18-2016.

## 2022-02-18 ENCOUNTER — Inpatient Hospital Stay (HOSPITAL_COMMUNITY)
Admission: AD | Admit: 2022-02-18 | Discharge: 2022-02-18 | Disposition: A | Discharge status: Home / Self Care | Payer: Medicaid Other | Attending: Obstetrics and Gynecology | Admitting: Obstetrics and Gynecology

## 2022-02-18 ENCOUNTER — Encounter (HOSPITAL_COMMUNITY): Payer: Self-pay | Admitting: *Deleted

## 2022-02-18 ENCOUNTER — Inpatient Hospital Stay (HOSPITAL_COMMUNITY): Payer: Medicaid Other

## 2022-02-18 DIAGNOSIS — A599 Trichomoniasis, unspecified: Secondary | ICD-10-CM

## 2022-02-18 DIAGNOSIS — O26891 Other specified pregnancy related conditions, first trimester: Secondary | ICD-10-CM | POA: Diagnosis not present

## 2022-02-18 DIAGNOSIS — R102 Pelvic and perineal pain: Secondary | ICD-10-CM | POA: Diagnosis not present

## 2022-02-18 DIAGNOSIS — Z792 Long term (current) use of antibiotics: Secondary | ICD-10-CM | POA: Insufficient documentation

## 2022-02-18 DIAGNOSIS — Z3490 Encounter for supervision of normal pregnancy, unspecified, unspecified trimester: Secondary | ICD-10-CM

## 2022-02-18 DIAGNOSIS — N939 Abnormal uterine and vaginal bleeding, unspecified: Secondary | ICD-10-CM

## 2022-02-18 DIAGNOSIS — O209 Hemorrhage in early pregnancy, unspecified: Secondary | ICD-10-CM | POA: Diagnosis present

## 2022-02-18 HISTORY — DX: Trichomoniasis, unspecified: A59.9

## 2022-02-18 LAB — CBC
HCT: 34.9 % — ABNORMAL LOW (ref 36.0–46.0)
Hemoglobin: 11.7 g/dL — ABNORMAL LOW (ref 12.0–15.0)
MCH: 31.4 pg (ref 26.0–34.0)
MCHC: 33.5 g/dL (ref 30.0–36.0)
MCV: 93.6 fL (ref 80.0–100.0)
Platelets: 208 10*3/uL (ref 150–400)
RBC: 3.73 MIL/uL — ABNORMAL LOW (ref 3.87–5.11)
RDW: 12.7 % (ref 11.5–15.5)
WBC: 6.4 10*3/uL (ref 4.0–10.5)
nRBC: 0 % (ref 0.0–0.2)

## 2022-02-18 LAB — WET PREP, GENITAL
Sperm: NONE SEEN
WBC, Wet Prep HPF POC: <10 (ref <10)
Yeast Wet Prep HPF POC: NONE SEEN

## 2022-02-18 LAB — URINALYSIS, ROUTINE W REFLEX MICROSCOPIC
Bilirubin Urine: NEGATIVE
Glucose, UA: NEGATIVE mg/dL
Hgb urine dipstick: NEGATIVE
Ketones, ur: NEGATIVE mg/dL
Leukocytes,Ua: NEGATIVE
Nitrite: NEGATIVE
Protein, ur: NEGATIVE mg/dL
Specific Gravity, Urine: 1.011 (ref 1.005–1.030)
pH: 8 (ref 5.0–8.0)

## 2022-02-18 LAB — HCG, QUANTITATIVE, PREGNANCY: hCG, Beta Chain, Quant, S: 12768 m[IU]/mL — ABNORMAL HIGH (ref <5)

## 2022-02-18 LAB — POCT PREGNANCY, URINE: Preg Test, Ur: POSITIVE — AB

## 2022-02-18 LAB — HIV ANTIBODY (ROUTINE TESTING W REFLEX): HIV Screen 4th Generation wRfx: NONREACTIVE

## 2022-02-18 MED ORDER — METRONIDAZOLE 500 MG PO TABS: 500.0000 mg | ORAL_TABLET | Freq: Two times a day (BID) | ORAL | 0 refills | Status: DC

## 2022-02-18 NOTE — MAU Provider Note (Signed)
?History  ?  ? ?CSN: [id number] ? ?Arrival date and time: 02/18/22 1711 ? ? Event Date/Time  ? First Provider Initiated Contact with Patient 02/18/22 2152   ?  ? ?Chief Complaint  ?Patient presents with  ? Vaginal Bleeding  ? Abdominal Pain  ? Possible Pregnancy  ? ?[name] is a 31 y.o. [id number] at [redacted]w[redacted]d by unsure LMP of December 22, 2021.  She presents today for Vaginal Bleeding, Abdominal Pain, and Possible Pregnancy.  She reports having spotting, with wiping, that started 3 days ago.  She also reports lower abdominal cramping that started after the spotting.  She reports taking tylenol with some initial relief, but subsequent dosing was not successful.  She denies issues with with urination, constipation, or diarrhea. She reports some vomiting, but no nausea. She endorses recent sexual activity, but without pain or discomfort.  ? ? ? ?OB History   ? ? Gravida  ?4  ? Para  ?3  ? Term  ?3  ? Preterm  ?0  ? AB  ?0  ? Living  ?3  ?  ? ? SAB  ?0  ? IAB  ?0  ? Ectopic  ?0  ? Multiple  ?0  ? Live Births  ?3  ?   ?  ?  ? ? ?Past Medical History:  ?Diagnosis Date  ? Anxiety   ? Depression   ? Depression   ? Mental disorder   ? depression  ? Sickle cell trait (HCC)   ? ? ?Past Surgical History:  ?Procedure Laterality Date  ? NO PAST SURGERIES    ? WISDOM TOOTH EXTRACTION    ? ? ?Family History  ?Problem Relation Age of Onset  ? Bipolar disorder Mother   ? Schizophrenia Mother   ? Drug abuse Mother   ? Alcohol abuse Mother   ? Hypertension Mother   ? Drug abuse Father   ? Anesthesia problems Neg Hx   ? Hypotension Neg Hx   ? Malignant hyperthermia Neg Hx   ? Pseudochol deficiency Neg Hx   ? ? ?Social History  ? ?Tobacco Use  ? Smoking status: Former  ?  Packs/day: 0.05  ?  Years: 6.00  ?  Pack years: 0.30  ?  Types: Cigarettes  ?  Quit date: 10/28/2018  ?  Years since quitting: 3.3  ? Smokeless tobacco: Never  ?Vaping Use  ? Vaping Use: Never used  ?Substance Use Topics  ? Alcohol use: No  ?  Alcohol/week: 0.0 standard  drinks  ? Drug use: Not Currently  ?  Types: Other-see comments  ? ? ?Allergies: No Known Allergies ? ?Medications Prior to Admission  ?Medication Sig Dispense Refill Last Dose  ? ARIPiprazole (ABILIFY) 5 MG tablet Take 5 mg by mouth daily.     ? busPIRone (BUSPAR) 7.5 MG tablet Take 1 tablet (7.5 mg total) by mouth 3 (three) times daily. 90 tablet 1   ? escitalopram (LEXAPRO) 5 MG tablet Take 5 mg by mouth daily.     ? levonorgestrel (MIRENA) 20 MCG/24HR IUD 1 each by Intrauterine route once.     ? methocarbamol (ROBAXIN) 500 MG tablet Take 1 tablet (500 mg total) by mouth every 8 (eight) hours as needed for muscle spasms. 90 tablet 3   ? ? ?Review of Systems  ?Gastrointestinal:  Positive for abdominal pain and vomiting. Negative for nausea.  ?Genitourinary:  Positive for vaginal bleeding. Negative for difficulty urinating, dyspareunia, dysuria and vaginal discharge.  ?Physical Exam  ? ?  Blood pressure 107/64, pulse 67, temperature 98.5 ?F (36.9 ?C), temperature source Oral, resp. rate 17, height 5\' 5"  (1.651 m), weight 62.1 kg, last menstrual period 12/22/2021, SpO2 100 %, currently breastfeeding. ? ?Physical Exam ?Constitutional:   ?   General: She is not in acute distress. ?   Appearance: Normal appearance. She is well-developed.  ?HENT:  ?   Head: Normocephalic and atraumatic.  ?Eyes:  ?   Conjunctiva/sclera: Conjunctivae normal.  ?Cardiovascular:  ?   Rate and Rhythm: Normal rate.  ?Pulmonary:  ?   Effort: Pulmonary effort is normal. No respiratory distress.  ?   Breath sounds: Normal breath sounds.  ?Musculoskeletal:  ?   Cervical back: Normal range of motion.  ?Skin: ?   General: Skin is warm.  ?Neurological:  ?   Mental Status: She is alert and oriented to person, place, and time.  ?Psychiatric:     ?   Mood and Affect: Mood normal.     ?   Behavior: Behavior normal.  ? ? ?MAU Course  ?Procedures ?Results for orders placed or performed during the hospital encounter of 02/18/22 (from the past 24 hour(s))   ?Pregnancy, urine POC     Status: Abnormal  ? Collection Time: 02/18/22  6:14 PM  ?Result Value Ref Range  ? Preg Test, Ur POSITIVE (A) NEGATIVE  ?Urinalysis, Routine w reflex microscopic Urine, Clean Catch     Status: None  ? Collection Time: 02/18/22  6:21 PM  ?Result Value Ref Range  ? Color, Urine YELLOW YELLOW  ? APPearance CLEAR CLEAR  ? Specific Gravity, Urine 1.011 1.005 - 1.030  ? pH 8.0 5.0 - 8.0  ? Glucose, UA NEGATIVE NEGATIVE mg/dL  ? Hgb urine dipstick NEGATIVE NEGATIVE  ? Bilirubin Urine NEGATIVE NEGATIVE  ? Ketones, ur NEGATIVE NEGATIVE mg/dL  ? Protein, ur NEGATIVE NEGATIVE mg/dL  ? Nitrite NEGATIVE NEGATIVE  ? Leukocytes,Ua NEGATIVE NEGATIVE  ?CBC     Status: Abnormal  ? Collection Time: 02/18/22  7:28 PM  ?Result Value Ref Range  ? WBC 6.4 4.0 - 10.5 K/uL  ? RBC 3.73 (L) 3.87 - 5.11 MIL/uL  ? Hemoglobin 11.7 (L) 12.0 - 15.0 g/dL  ? HCT 34.9 (L) 36.0 - 46.0 %  ? MCV 93.6 80.0 - 100.0 fL  ? MCH 31.4 26.0 - 34.0 pg  ? MCHC 33.5 30.0 - 36.0 g/dL  ? RDW 12.7 11.5 - 15.5 %  ? Platelets 208 150 - 400 K/uL  ? nRBC 0.0 0.0 - 0.2 %  ?hCG, quantitative, pregnancy     Status: Abnormal  ? Collection Time: 02/18/22  7:28 PM  ?Result Value Ref Range  ? hCG, Beta Chain, Quant, S 12,768 (H) <5 mIU/mL  ?HIV Antibody (routine testing w rflx)     Status: None  ? Collection Time: 02/18/22  7:28 PM  ?Result Value Ref Range  ? HIV Screen 4th Generation wRfx Non Reactive Non Reactive  ?Wet prep, genital     Status: Abnormal  ? Collection Time: 02/18/22  8:45 PM  ?Result Value Ref Range  ? Yeast Wet Prep HPF POC NONE SEEN NONE SEEN  ? Trich, Wet Prep PRESENT (A) NONE SEEN  ? Clue Cells Wet Prep HPF POC PRESENT (A) NONE SEEN  ? WBC, Wet Prep HPF POC <10 <10  ? Sperm NONE SEEN   ? ? ?MDM ?Cultures: Wet Prep and GC/CT ?Labs: UA, UPT, CBC, hCG ?Ultrasound ?Prescription ?Partner Therapy ?Assessment and Plan  ?31 year old ?[id number]  at 8.2 weeks ?Abdominal Cramping ?Vaginal Spotting ?Trichomoniasis ? ?-Labs and [place] ordered by  previous provider while patient in waiting room. ?-Results as above.  ?-Provider to bedside to collect HPI and inform of findings. ?-Discussed need for STI treatment.  Patient offered and accepts EPT.   ?-Rx for Magdalene River DOB 10/21/1989 given to patient. ?-Instructed to abstain from sexual activity for at least 10 days after completion of medications. ?-Discussed need to return in 48-60 hrs for repeat hCG. ?-Appt made per protocol. ?-Reassured that abdominal cramping should improve with onset of therapy.  Encouraged to utilize tylenol, as needed, until that time. ?-Patient verbalizes understanding and without questions or concerns.  ?-Encouraged to call primary office or return to MAU if symptoms worsen or with the onset of new symptoms. ?-Discharged to home in stable condition. ? ? ?Cherre Robins ?02/18/2022, 9:52 PM  ?

## 2022-02-18 NOTE — MAU Note (Signed)
[name] is a 31 y.o. here in MAU reporting: found out she was preg 4/25.  Has called office, has appt 5/21.  Has been spotting and cramping. Been stressed.  ?LMP: 3/13 ?Onset of complaint: 3 days ago ?Pain score: mild/mod ?Vitals:  ? 02/18/22 1808  ?BP: 107/64  ?Pulse: 67  ?Resp: 17  ?Temp: 98.5 ?F (36.9 ?C)  ?SpO2: 100%  ?   ?Lab orders placed from triage:  urine preg ?

## 2022-02-18 NOTE — MAU Note (Signed)
RN discussed with patient discharge instructions. Pt verbalized understanding, and signed discharge documentation.  ?

## 2022-02-19 LAB — GC/CHLAMYDIA PROBE AMP (~~LOC~~) NOT AT ARMC
Chlamydia: NEGATIVE
Comment: NEGATIVE
Comment: NORMAL
Neisseria Gonorrhea: POSITIVE — AB

## 2022-02-20 ENCOUNTER — Other Ambulatory Visit (HOSPITAL_COMMUNITY): Payer: Medicaid Other | Attending: Obstetrics and Gynecology

## 2022-03-07 ENCOUNTER — Inpatient Hospital Stay (HOSPITAL_COMMUNITY): Payer: Medicaid Other

## 2022-03-07 ENCOUNTER — Inpatient Hospital Stay (HOSPITAL_COMMUNITY)
Admission: AD | Admit: 2022-03-07 | Discharge: 2022-03-08 | Disposition: A | Discharge status: Home / Self Care | Payer: Medicaid Other | Attending: Obstetrics and Gynecology | Admitting: Obstetrics and Gynecology

## 2022-03-07 ENCOUNTER — Encounter (HOSPITAL_COMMUNITY): Payer: Self-pay | Admitting: Obstetrics and Gynecology

## 2022-03-07 DIAGNOSIS — Z3A10 10 weeks gestation of pregnancy: Secondary | ICD-10-CM | POA: Diagnosis not present

## 2022-03-07 DIAGNOSIS — O23591 Infection of other part of genital tract in pregnancy, first trimester: Secondary | ICD-10-CM | POA: Insufficient documentation

## 2022-03-07 DIAGNOSIS — A5901 Trichomonal vulvovaginitis: Secondary | ICD-10-CM | POA: Diagnosis not present

## 2022-03-07 DIAGNOSIS — O039 Complete or unspecified spontaneous abortion without complication: Secondary | ICD-10-CM

## 2022-03-07 DIAGNOSIS — O98211 Gonorrhea complicating pregnancy, first trimester: Secondary | ICD-10-CM

## 2022-03-07 LAB — CBC
HCT: 34.4 % — ABNORMAL LOW (ref 36.0–46.0)
Hemoglobin: 11.5 g/dL — ABNORMAL LOW (ref 12.0–15.0)
MCH: 30.9 pg (ref 26.0–34.0)
MCHC: 33.4 g/dL (ref 30.0–36.0)
MCV: 92.5 fL (ref 80.0–100.0)
Platelets: 209 10*3/uL (ref 150–400)
RBC: 3.72 MIL/uL — ABNORMAL LOW (ref 3.87–5.11)
RDW: 12.3 % (ref 11.5–15.5)
WBC: 8 10*3/uL (ref 4.0–10.5)
nRBC: 0 % (ref 0.0–0.2)

## 2022-03-07 LAB — URINALYSIS, ROUTINE W REFLEX MICROSCOPIC
Bilirubin Urine: NEGATIVE
Glucose, UA: NEGATIVE mg/dL
Ketones, ur: NEGATIVE mg/dL
Nitrite: NEGATIVE
Protein, ur: 100 mg/dL — AB
Specific Gravity, Urine: 1.028 (ref 1.005–1.030)
pH: 5 (ref 5.0–8.0)

## 2022-03-07 MED ORDER — METRONIDAZOLE 500 MG PO TABS
2000.0000 mg | ORAL_TABLET | Freq: Once | ORAL | Status: AC
  Administered 2022-03-08: 2000 mg via ORAL
  Filled 2022-03-07: qty 4

## 2022-03-07 MED ORDER — AZITHROMYCIN 250 MG PO TABS: 1000.0000 mg | ORAL_TABLET | Freq: Once | ORAL | Status: DC

## 2022-03-07 MED ORDER — CEFTRIAXONE SODIUM 500 MG IJ SOLR
500.0000 mg | Freq: Once | INTRAMUSCULAR | Status: AC
  Administered 2022-03-08: 500 mg via INTRAMUSCULAR
  Filled 2022-03-07: qty 500

## 2022-03-07 NOTE — MAU Provider Note (Signed)
History     [id number]  Arrival date and time: 03/07/22 2150    Chief Complaint  Patient presents with   Vaginal Bleeding     HPI [name] is a 31 y.o. at [redacted]w[redacted]d who presents for vaginal bleeding. Reports pink spotting for the last 4 days that increased some earlier today. Reports dark red blood & small blood clot this morning. Not saturating pads and not continuing to pass blood clots. Reports some lower abdominal cramping that she rates 4/10. Last intercourse was a week ago. Was diagnosed with trichomonas & gonorrhea a few weeks ago but neither her nor her partner have been treated.   OB History     Gravida  4   Para  3   Term  3   Preterm  0   AB  0   Living  3      SAB  0   IAB  0   Ectopic  0   Multiple  0   Live Births  3           Past Medical History:  Diagnosis Date   Anxiety    Depression    Sickle cell trait (HCC)     Past Surgical History:  Procedure Laterality Date   WISDOM TOOTH EXTRACTION      Family History  Problem Relation Age of Onset   Bipolar disorder Mother    Schizophrenia Mother    Drug abuse Mother    Alcohol abuse Mother    Hypertension Mother    Drug abuse Father    Anesthesia problems Neg Hx    Hypotension Neg Hx    Malignant hyperthermia Neg Hx    Pseudochol deficiency Neg Hx     No Known Allergies  No current facility-administered medications on file prior to encounter.   Current Outpatient Medications on File Prior to Encounter  Medication Sig Dispense Refill   prenatal vitamin w/FE, FA (PRENATAL 1 + 1) 27-1 MG TABS tablet Take 1 tablet by mouth daily at 12 noon.       ROS Pertinent positives and negative per HPI, all others reviewed and negative  Physical Exam   BP 130/73 Comment: repeat bp; patient upset and crying  Pulse 74   Temp 98.1 F (36.7 C) (Oral)   Resp 18   Ht 5\' 5"  (1.651 m)   Wt 60.8 kg   LMP 12/22/2021   SpO2 99% Comment: room air  BMI 22.30 kg/m   Patient Vitals  for the past 24 hrs:  BP Temp Temp src Pulse Resp SpO2 Height Weight  03/08/22 0105 130/73 -- -- 74 -- -- -- --  03/07/22 2236 (!) 108/59 -- -- 67 18 99 % -- --  03/07/22 2208 117/66 98.1 F (36.7 C) Oral 69 17 99 % 5\' 5"  (1.651 m) 60.8 kg    Physical Exam Vitals and nursing note reviewed. Exam conducted with a chaperone present.  Constitutional:      General: She is not in acute distress.    Appearance: Normal appearance.  HENT:     Head: Normocephalic and atraumatic.  Eyes:     General: No scleral icterus.    Conjunctiva/sclera: Conjunctivae normal.  Pulmonary:     Effort: Pulmonary effort is normal. No respiratory distress.  Abdominal:     Palpations: Abdomen is soft.     Tenderness: There is no abdominal tenderness.  Skin:    General: Skin is warm and dry.  Neurological:  Mental Status: She is alert.  Psychiatric:        Mood and Affect: Mood normal.        Behavior: Behavior normal.      Labs Results for orders placed or performed during the hospital encounter of 03/07/22 (from the past 24 hour(s))  Urinalysis, Routine w reflex microscopic Urine, Clean Catch     Status: Abnormal   Collection Time: 03/07/22 10:32 PM  Result Value Ref Range   Color, Urine AMBER (A) YELLOW   APPearance HAZY (A) CLEAR   Specific Gravity, Urine 1.028 1.005 - 1.030   pH 5.0 5.0 - 8.0   Glucose, UA NEGATIVE NEGATIVE mg/dL   Hgb urine dipstick MODERATE (A) NEGATIVE   Bilirubin Urine NEGATIVE NEGATIVE   Ketones, ur NEGATIVE NEGATIVE mg/dL   Protein, ur 191100 (A) NEGATIVE mg/dL   Nitrite NEGATIVE NEGATIVE   Leukocytes,Ua TRACE (A) NEGATIVE   RBC / HPF 0-5 0 - 5 RBC/hpf   WBC, UA 6-10 0 - 5 WBC/hpf   Bacteria, UA RARE (A) NONE SEEN   Squamous Epithelial / LPF 0-5 0 - 5   Mucus PRESENT    Hyaline Casts, UA PRESENT    Ca Oxalate Crys, UA PRESENT   CBC     Status: Abnormal   Collection Time: 03/07/22 11:04 PM  Result Value Ref Range   WBC 8.0 4.0 - 10.5 K/uL   RBC 3.72 (L) 3.87 -  5.11 MIL/uL   Hemoglobin 11.5 (L) 12.0 - 15.0 g/dL   HCT 47.834.4 (L) 29.536.0 - 62.146.0 %   MCV 92.5 80.0 - 100.0 fL   MCH 30.9 26.0 - 34.0 pg   MCHC 33.4 30.0 - 36.0 g/dL   RDW 30.812.3 65.711.5 - 84.615.5 %   Platelets 209 150 - 400 K/uL   nRBC 0.0 0.0 - 0.2 %  hCG, quantitative, pregnancy     Status: Abnormal   Collection Time: 03/07/22 11:04 PM  Result Value Ref Range   hCG, Beta Chain, Quant, S 5,962 (H) <5 mIU/mL    Imaging US OB Transvaginal  Addendum Date: 03/08/2022   ADDENDUM REPORT: 03/08/2022 00:40 COMPARISON:  Obstetrical ultrasound 02/18/2022. IMPRESSION: 1. Again seen is empty gestational sac without interval appropriate growth. Findings are compatible with failed intrauterine pregnancy. These results were called by telephone at the time of interpretation on 03/08/2022 at 12:38 am to provider Judeth HornERIN Sorayah Schrodt , who verbally acknowledged these results. Electronically Signed   By: Darliss CheneyAmy  Guttmann M.D.   On: 03/08/2022 00:40   Result Date: 03/08/2022 CLINICAL DATA:  Cramping and bleeding. EXAM: TRANSVAGINAL OB ULTRASOUND TECHNIQUE: Transvaginal ultrasound was performed for complete evaluation of the gestation as well as the maternal uterus, adnexal regions, and pelvic cul-de-sac. COMPARISON:  None Available. FINDINGS: Intrauterine gestational sac: Single Yolk sac:  Not Visualized. Embryo:  Not Visualized. MSD: 14.5 mm   6 w   2 d Subchorionic hemorrhage:  None visualized. Maternal uterus/adnexae: Right ovary appears within normal limits. Left ovary not visualized. Trace free fluid in the pelvis. IMPRESSION: 1. Probable early intrauterine gestational sac, but no yolk sac, fetal pole, or cardiac activity yet visualized. Recommend follow-up quantitative B-HCG levels and follow-up US in 14 days to assess viability. This recommendation follows SRU consensus guidelines: Diagnostic Criteria for Nonviable Pregnancy Early in the First Trimester. Malva Limes Engl J Med 2013; 962:9528-41; 369:1443-51. 2. Trace free fluid in the pelvis.  Electronically Signed: By: Darliss CheneyAmy  Guttmann M.D. On: 03/08/2022 00:05    MAU Course  Procedures Lab Orders  Urinalysis, Routine w reflex microscopic Urine, Clean Catch         CBC         hCG, quantitative, pregnancy     Meds ordered this encounter  Medications   DISCONTD: azithromycin (ZITHROMAX) tablet 1,000 mg   cefTRIAXone (ROCEPHIN) injection 500 mg    Order Specific Question:   Antibiotic Indication:    Answer:   STD   metroNIDAZOLE (FLAGYL) tablet 2,000 mg   lidocaine (PF) (XYLOCAINE) 1 % injection 1 mL   lidocaine (PF) (XYLOCAINE) 1 % injection    Chriss Driver S: cabinet override   Imaging Orders         US OB Transvaginal      MDM Flagyl 2 gm PO & rocephin 500 mg IM given in MAU to treat trichomonas & gonorrhea.   HCG dropped from 84166>0630. Continues to show empty IUGS >14 days since last ultrasound - meets diagnostic criteria for failed pregnancy.   Discussed options for management of incomplete AB including expectant management, Cytotec or D&C. Prefers expectant management at this time. Verbalizes understanding that intervention may become necessary if SAB in not completed spontaneously or if heavy bleeding or infection occur.   RH positive Assessment and Plan   1. Miscarriage  -Patients preference is expectant management. Reviewed bleeding/infection precautions & reasons to return to MAU. Message sent to [facility] for f/u appointment.   2. Trichomonal vaginitis during pregnancy in first trimester  -Tx tonight. Discussed abstinence x 2 weeks after treatment. [facility] should go to [facility] for tx of trichomonas & gonorrhea.   3. Gonorrhea affecting pregnancy in first trimester   4. [redacted] weeks gestation of pregnancy      Judeth Horn, NP 03/08/22 1:42 AM

## 2022-03-07 NOTE — MAU Note (Signed)
.  [name] is a 31 y.o. at [redacted]w[redacted]d here in MAU reporting: 3-4 days ago she started having pink discharge with light cramping. Today vaginal bleeding is -dark red with small clot on toilet tissue at 1900 this evening - pt has been wearing a pantliner -pt states cramping is today more "like period cramping". Last intercourse was 1 week ago Pt also states that she needs a shot for an STD  Pain score: 3-4 in lower abdomen and rotating in to her lower back Vitals:   03/07/22 2208  BP: 117/66  Pulse: 69  Resp: 17  Temp: 98.1 F (36.7 C)  SpO2: 99%      Lab orders placed from triage:   UA

## 2022-03-08 DIAGNOSIS — O039 Complete or unspecified spontaneous abortion without complication: Secondary | ICD-10-CM

## 2022-03-08 DIAGNOSIS — Z3A10 10 weeks gestation of pregnancy: Secondary | ICD-10-CM

## 2022-03-08 LAB — HCG, QUANTITATIVE, PREGNANCY: hCG, Beta Chain, Quant, S: 5962 m[IU]/mL — ABNORMAL HIGH (ref <5)

## 2022-03-08 MED ORDER — LIDOCAINE HCL (PF) 1 % IJ SOLN: 1.0000 mL | Freq: Once | INTRAMUSCULAR | Status: AC

## 2022-03-08 MED ORDER — LIDOCAINE HCL (PF) 1 % IJ SOLN
INTRAMUSCULAR | Status: AC
  Administered 2022-03-08: 1 mL
  Filled 2022-03-08: qty 5

## 2022-03-08 NOTE — Discharge Instructions (Signed)
Return to care  If you have heavier bleeding that soaks through more than 2 pads per hour for an hour or more If you bleed so much that you feel like you might pass out or you do pass out If you have significant abdominal pain that is not improved with Tylenol   

## 2022-03-17 ENCOUNTER — Encounter: Payer: Self-pay | Admitting: *Deleted

## 2022-03-25 ENCOUNTER — Ambulatory Visit: Payer: Medicaid Other | Admitting: Student

## 2022-11-26 ENCOUNTER — Inpatient Hospital Stay (HOSPITAL_COMMUNITY): Payer: Medicaid Other | Admitting: Critical Care Medicine

## 2022-11-26 ENCOUNTER — Inpatient Hospital Stay (HOSPITAL_COMMUNITY): Payer: Medicaid Other

## 2022-11-26 ENCOUNTER — Other Ambulatory Visit: Payer: Self-pay

## 2022-11-26 ENCOUNTER — Inpatient Hospital Stay (HOSPITAL_COMMUNITY)
Admission: EM | Admit: 2022-11-26 | Discharge: 2022-11-28 | DRG: 480 | Discharge status: Against Medical Advice | Payer: Medicaid Other | Attending: Student | Admitting: Student

## 2022-11-26 ENCOUNTER — Encounter (HOSPITAL_COMMUNITY)
Admission: EM | Discharge status: Against Medical Advice | Payer: Self-pay | Source: Home / Self Care | Attending: Student

## 2022-11-26 ENCOUNTER — Encounter (HOSPITAL_COMMUNITY): Payer: Self-pay

## 2022-11-26 ENCOUNTER — Emergency Department (HOSPITAL_COMMUNITY): Payer: Medicaid Other

## 2022-11-26 DIAGNOSIS — S7292XA Unspecified fracture of left femur, initial encounter for closed fracture: Secondary | ICD-10-CM

## 2022-11-26 DIAGNOSIS — S72333B Displaced oblique fracture of shaft of unspecified femur, initial encounter for open fracture type I or II: Principal | ICD-10-CM

## 2022-11-26 DIAGNOSIS — Z5329 Procedure and treatment not carried out because of patient's decision for other reasons: Secondary | ICD-10-CM | POA: Diagnosis present

## 2022-11-26 DIAGNOSIS — S7291XA Unspecified fracture of right femur, initial encounter for closed fracture: Secondary | ICD-10-CM

## 2022-11-26 DIAGNOSIS — D62 Acute posthemorrhagic anemia: Secondary | ICD-10-CM | POA: Diagnosis not present

## 2022-11-26 DIAGNOSIS — S72302A Unspecified fracture of shaft of left femur, initial encounter for closed fracture: Secondary | ICD-10-CM | POA: Diagnosis present

## 2022-11-26 DIAGNOSIS — W3400XA Accidental discharge from unspecified firearms or gun, initial encounter: Secondary | ICD-10-CM

## 2022-11-26 DIAGNOSIS — S72301A Unspecified fracture of shaft of right femur, initial encounter for closed fracture: Secondary | ICD-10-CM | POA: Diagnosis present

## 2022-11-26 DIAGNOSIS — D573 Sickle-cell trait: Secondary | ICD-10-CM | POA: Diagnosis present

## 2022-11-26 HISTORY — PX: FEMUR IM NAIL: SHX1597

## 2022-11-26 HISTORY — PX: OTHER SURGICAL HISTORY: SHX169

## 2022-11-26 HISTORY — DX: Unspecified fracture of right femur, initial encounter for closed fracture: S72.91XA

## 2022-11-26 LAB — CBC
HCT: 38.1 % (ref 36.0–46.0)
Hemoglobin: 12.6 g/dL (ref 12.0–15.0)
MCH: 30 pg (ref 26.0–34.0)
MCHC: 33.1 g/dL (ref 30.0–36.0)
MCV: 90.7 fL (ref 80.0–100.0)
Platelets: 217 10*3/uL (ref 150–400)
RBC: 4.2 MIL/uL (ref 3.87–5.11)
RDW: 12.4 % (ref 11.5–15.5)
WBC: 9.4 10*3/uL (ref 4.0–10.5)
nRBC: 0 % (ref 0.0–0.2)

## 2022-11-26 LAB — HCG, QUANTITATIVE, PREGNANCY: hCG, Beta Chain, Quant, S: 1 m[IU]/mL (ref <5)

## 2022-11-26 LAB — I-STAT CHEM 8, ED
BUN: 16 mg/dL (ref 6–20)
Calcium, Ion: 1.13 mmol/L — ABNORMAL LOW (ref 1.15–1.40)
Chloride: 102 mmol/L (ref 98–111)
Creatinine, Ser: 0.8 mg/dL (ref 0.44–1.00)
Glucose, Bld: 159 mg/dL — ABNORMAL HIGH (ref 70–99)
HCT: 41 % (ref 36.0–46.0)
Hemoglobin: 13.9 g/dL (ref 12.0–15.0)
Potassium: 3.5 mmol/L (ref 3.5–5.1)
Sodium: 139 mmol/L (ref 135–145)
TCO2: 21 mmol/L — ABNORMAL LOW (ref 22–32)

## 2022-11-26 LAB — COMPREHENSIVE METABOLIC PANEL
ALT: 17 U/L (ref 0–44)
AST: 28 U/L (ref 15–41)
Albumin: 4.1 g/dL (ref 3.5–5.0)
Alkaline Phosphatase: 31 U/L — ABNORMAL LOW (ref 38–126)
Anion gap: 14 (ref 5–15)
BUN: 14 mg/dL (ref 6–20)
CO2: 21 mmol/L — ABNORMAL LOW (ref 22–32)
Calcium: 9.2 mg/dL (ref 8.9–10.3)
Chloride: 103 mmol/L (ref 98–111)
Creatinine, Ser: 0.98 mg/dL (ref 0.44–1.00)
GFR, Estimated: >60 mL/min (ref >60)
Glucose, Bld: 165 mg/dL — ABNORMAL HIGH (ref 70–99)
Potassium: 3.5 mmol/L (ref 3.5–5.1)
Sodium: 138 mmol/L (ref 135–145)
Total Bilirubin: <0.1 mg/dL — ABNORMAL LOW (ref 0.3–1.2)
Total Protein: 7 g/dL (ref 6.5–8.1)

## 2022-11-26 LAB — LACTIC ACID, PLASMA: Lactic Acid, Venous: 5.3 mmol/L (ref 0.5–1.9)

## 2022-11-26 LAB — SAMPLE TO BLOOD BANK

## 2022-11-26 LAB — ETHANOL: Alcohol, Ethyl (B): <10 mg/dL (ref <10)

## 2022-11-26 LAB — PROTIME-INR
INR: 1 (ref 0.8–1.2)
Prothrombin Time: 13.5 seconds (ref 11.4–15.2)

## 2022-11-26 LAB — HIV ANTIBODY (ROUTINE TESTING W REFLEX): HIV Screen 4th Generation wRfx: NONREACTIVE

## 2022-11-26 SURGERY — INSERTION, INTRAMEDULLARY ROD, FEMUR, RETROGRADE
Anesthesia: General | Laterality: Bilateral

## 2022-11-26 MED ORDER — PROPOFOL 10 MG/ML IV BOLUS
INTRAVENOUS | Status: DC | PRN
  Administered 2022-11-26: 130 mg via INTRAVENOUS

## 2022-11-26 MED ORDER — ACETAMINOPHEN 10 MG/ML IV SOLN
1000.0000 mg | Freq: Once | INTRAVENOUS | Status: DC | PRN
  Administered 2022-11-26: 1000 mg via INTRAVENOUS

## 2022-11-26 MED ORDER — HYDROMORPHONE HCL 1 MG/ML IJ SOLN
INTRAMUSCULAR | Status: AC
  Filled 2022-11-26: qty 0.5

## 2022-11-26 MED ORDER — LIDOCAINE 2% (20 MG/ML) 5 ML SYRINGE
INTRAMUSCULAR | Status: DC | PRN
  Administered 2022-11-26: 60 mg via INTRAVENOUS

## 2022-11-26 MED ORDER — FENTANYL CITRATE PF 50 MCG/ML IJ SOSY
PREFILLED_SYRINGE | INTRAMUSCULAR | Status: AC | PRN
  Administered 2022-11-26 (×2): 50 ug via INTRAVENOUS

## 2022-11-26 MED ORDER — 0.9 % SODIUM CHLORIDE (POUR BTL) OPTIME
TOPICAL | Status: DC | PRN
  Administered 2022-11-26: 1000 mL

## 2022-11-26 MED ORDER — CHLORHEXIDINE GLUCONATE 0.12 % MT SOLN: 15.0000 mL | Freq: Once | OROMUCOSAL | Status: DC

## 2022-11-26 MED ORDER — FENTANYL CITRATE (PF) 250 MCG/5ML IJ SOLN
INTRAMUSCULAR | Status: DC | PRN
  Administered 2022-11-26 (×5): 50 ug via INTRAVENOUS

## 2022-11-26 MED ORDER — LACTATED RINGERS IV SOLN: INTRAVENOUS | Status: DC

## 2022-11-26 MED ORDER — OXYCODONE HCL 5 MG PO TABS
5.0000 mg | ORAL_TABLET | Freq: Once | ORAL | Status: AC
  Administered 2022-11-26: 5 mg via ORAL

## 2022-11-26 MED ORDER — ONDANSETRON HCL 4 MG PO TABS: 4.0000 mg | ORAL_TABLET | Freq: Four times a day (QID) | ORAL | Status: DC | PRN

## 2022-11-26 MED ORDER — CEFAZOLIN SODIUM-DEXTROSE 2-4 GM/100ML-% IV SOLN
2.0000 g | INTRAVENOUS | Status: AC
  Administered 2022-11-26: 2 g via INTRAVENOUS
  Filled 2022-11-26: qty 100

## 2022-11-26 MED ORDER — ORAL CARE MOUTH RINSE: 15.0000 mL | Freq: Once | OROMUCOSAL | Status: DC

## 2022-11-26 MED ORDER — MIDAZOLAM HCL 2 MG/2ML IJ SOLN
INTRAMUSCULAR | Status: AC
  Filled 2022-11-26: qty 2

## 2022-11-26 MED ORDER — DOCUSATE SODIUM 100 MG PO CAPS
100.0000 mg | ORAL_CAPSULE | Freq: Two times a day (BID) | ORAL | Status: DC
  Administered 2022-11-26 – 2022-11-28 (×3): 100 mg via ORAL
  Filled 2022-11-26 (×4): qty 1

## 2022-11-26 MED ORDER — METHOCARBAMOL 1000 MG/10ML IJ SOLN: 500.0000 mg | Freq: Four times a day (QID) | INTRAVENOUS | Status: DC | PRN

## 2022-11-26 MED ORDER — METHOCARBAMOL 500 MG PO TABS
500.0000 mg | ORAL_TABLET | Freq: Four times a day (QID) | ORAL | Status: DC | PRN
  Administered 2022-11-26 – 2022-11-28 (×4): 500 mg via ORAL
  Filled 2022-11-26 (×4): qty 1

## 2022-11-26 MED ORDER — ROCURONIUM BROMIDE 10 MG/ML (PF) SYRINGE
PREFILLED_SYRINGE | INTRAVENOUS | Status: DC | PRN
  Administered 2022-11-26: 40 mg via INTRAVENOUS
  Administered 2022-11-26: 20 mg via INTRAVENOUS

## 2022-11-26 MED ORDER — METOCLOPRAMIDE HCL 5 MG/ML IJ SOLN: 5.0000 mg | Freq: Three times a day (TID) | INTRAMUSCULAR | Status: DC | PRN

## 2022-11-26 MED ORDER — HYDRALAZINE HCL 10 MG PO TABS: 10.0000 mg | ORAL_TABLET | Freq: Four times a day (QID) | ORAL | Status: DC | PRN

## 2022-11-26 MED ORDER — CHLORHEXIDINE GLUCONATE 4 % EX LIQD: 60.0000 mL | Freq: Once | CUTANEOUS | Status: DC

## 2022-11-26 MED ORDER — SODIUM CHLORIDE 0.9 % IV SOLN: INTRAVENOUS | Status: DC

## 2022-11-26 MED ORDER — ACETAMINOPHEN 500 MG PO TABS
1000.0000 mg | ORAL_TABLET | Freq: Four times a day (QID) | ORAL | Status: DC
  Administered 2022-11-26 – 2022-11-28 (×7): 1000 mg via ORAL
  Filled 2022-11-26 (×8): qty 2

## 2022-11-26 MED ORDER — MIDAZOLAM HCL 2 MG/2ML IJ SOLN
INTRAMUSCULAR | Status: AC | PRN
  Administered 2022-11-26 (×2): 2 mg via INTRAVENOUS

## 2022-11-26 MED ORDER — KETOROLAC TROMETHAMINE 15 MG/ML IJ SOLN
15.0000 mg | Freq: Four times a day (QID) | INTRAMUSCULAR | Status: DC
  Administered 2022-11-26 – 2022-11-27 (×3): 15 mg via INTRAVENOUS
  Filled 2022-11-26 (×4): qty 1

## 2022-11-26 MED ORDER — DEXAMETHASONE SODIUM PHOSPHATE 10 MG/ML IJ SOLN
INTRAMUSCULAR | Status: DC | PRN
  Administered 2022-11-26: 10 mg via INTRAVENOUS

## 2022-11-26 MED ORDER — OXYCODONE HCL 5 MG PO TABS
ORAL_TABLET | ORAL | Status: AC
  Filled 2022-11-26: qty 1

## 2022-11-26 MED ORDER — FENTANYL CITRATE (PF) 100 MCG/2ML IJ SOLN
INTRAMUSCULAR | Status: AC
  Filled 2022-11-26: qty 2

## 2022-11-26 MED ORDER — FENTANYL CITRATE (PF) 100 MCG/2ML IJ SOLN
25.0000 ug | INTRAMUSCULAR | Status: DC | PRN
  Administered 2022-11-26: 50 ug via INTRAVENOUS

## 2022-11-26 MED ORDER — ONDANSETRON HCL 4 MG/2ML IJ SOLN
4.0000 mg | Freq: Four times a day (QID) | INTRAMUSCULAR | Status: DC | PRN
  Administered 2022-11-28: 4 mg via INTRAVENOUS
  Filled 2022-11-26: qty 2

## 2022-11-26 MED ORDER — ONDANSETRON HCL 4 MG/2ML IJ SOLN
INTRAMUSCULAR | Status: AC
  Filled 2022-11-26: qty 2

## 2022-11-26 MED ORDER — MIDAZOLAM HCL 2 MG/2ML IJ SOLN
INTRAMUSCULAR | Status: AC
  Filled 2022-11-26: qty 4

## 2022-11-26 MED ORDER — PROPOFOL 10 MG/ML IV BOLUS
INTRAVENOUS | Status: AC
  Filled 2022-11-26: qty 20

## 2022-11-26 MED ORDER — TRANEXAMIC ACID-NACL 1000-0.7 MG/100ML-% IV SOLN
1000.0000 mg | INTRAVENOUS | Status: AC
  Administered 2022-11-26: 1000 mg via INTRAVENOUS
  Filled 2022-11-26: qty 100

## 2022-11-26 MED ORDER — IOHEXOL 350 MG/ML SOLN
100.0000 mL | Freq: Once | INTRAVENOUS | Status: AC | PRN
  Administered 2022-11-26: 100 mL via INTRAVENOUS

## 2022-11-26 MED ORDER — ENOXAPARIN SODIUM 40 MG/0.4ML IJ SOSY
40.0000 mg | PREFILLED_SYRINGE | INTRAMUSCULAR | Status: DC
  Administered 2022-11-27 – 2022-11-28 (×2): 40 mg via SUBCUTANEOUS
  Filled 2022-11-26 (×2): qty 0.4

## 2022-11-26 MED ORDER — HYDROMORPHONE HCL 1 MG/ML IJ SOLN
INTRAMUSCULAR | Status: DC | PRN
  Administered 2022-11-26: .5 mg via INTRAVENOUS

## 2022-11-26 MED ORDER — CEFAZOLIN SODIUM-DEXTROSE 2-4 GM/100ML-% IV SOLN
2.0000 g | Freq: Three times a day (TID) | INTRAVENOUS | Status: AC
  Administered 2022-11-26 – 2022-11-27 (×3): 2 g via INTRAVENOUS
  Filled 2022-11-26 (×3): qty 100

## 2022-11-26 MED ORDER — ONDANSETRON HCL 4 MG/2ML IJ SOLN
INTRAMUSCULAR | Status: DC | PRN
  Administered 2022-11-26: 4 mg via INTRAVENOUS

## 2022-11-26 MED ORDER — SODIUM CHLORIDE 0.9 % IV SOLN
INTRAVENOUS | Status: AC | PRN
  Administered 2022-11-26: 1000 mL via INTRAVENOUS

## 2022-11-26 MED ORDER — MIDAZOLAM HCL 5 MG/5ML IJ SOLN
INTRAMUSCULAR | Status: DC | PRN
  Administered 2022-11-26 (×2): 1 mg via INTRAVENOUS

## 2022-11-26 MED ORDER — SUGAMMADEX SODIUM 200 MG/2ML IV SOLN
INTRAVENOUS | Status: DC | PRN
  Administered 2022-11-26: 200 mg via INTRAVENOUS

## 2022-11-26 MED ORDER — TETANUS-DIPHTHERIA TOXOIDS TD 5-2 LFU IM INJ: 0.5000 mL | INJECTION | Freq: Once | INTRAMUSCULAR | Status: DC

## 2022-11-26 MED ORDER — CEFAZOLIN SODIUM-DEXTROSE 2-4 GM/100ML-% IV SOLN
2.0000 g | Freq: Once | INTRAVENOUS | Status: AC
  Administered 2022-11-26: 2 g via INTRAVENOUS

## 2022-11-26 MED ORDER — SUCCINYLCHOLINE CHLORIDE 200 MG/10ML IV SOSY
PREFILLED_SYRINGE | INTRAVENOUS | Status: DC | PRN
  Administered 2022-11-26: 80 mg via INTRAVENOUS

## 2022-11-26 MED ORDER — TRANEXAMIC ACID-NACL 1000-0.7 MG/100ML-% IV SOLN
1000.0000 mg | Freq: Once | INTRAVENOUS | Status: AC
  Administered 2022-11-26: 1000 mg via INTRAVENOUS
  Filled 2022-11-26: qty 100

## 2022-11-26 MED ORDER — MORPHINE SULFATE (PF) 2 MG/ML IV SOLN
2.0000 mg | INTRAVENOUS | Status: DC | PRN
  Administered 2022-11-26 – 2022-11-27 (×3): 2 mg via INTRAVENOUS
  Filled 2022-11-26 (×3): qty 1

## 2022-11-26 MED ORDER — ACETAMINOPHEN 10 MG/ML IV SOLN
INTRAVENOUS | Status: AC
  Filled 2022-11-26: qty 100

## 2022-11-26 MED ORDER — TETANUS-DIPHTH-ACELL PERTUSSIS 5-2.5-18.5 LF-MCG/0.5 IM SUSY: 0.5000 mL | PREFILLED_SYRINGE | Freq: Once | INTRAMUSCULAR | Status: DC

## 2022-11-26 MED ORDER — POVIDONE-IODINE 10 % EX SWAB
2.0000 | Freq: Once | CUTANEOUS | Status: AC
  Administered 2022-11-26: 2 via TOPICAL

## 2022-11-26 MED ORDER — FENTANYL CITRATE PF 50 MCG/ML IJ SOSY
100.0000 ug | PREFILLED_SYRINGE | Freq: Once | INTRAMUSCULAR | Status: AC
  Administered 2022-11-26: 100 ug via INTRAVENOUS
  Filled 2022-11-26: qty 2

## 2022-11-26 MED ORDER — METOCLOPRAMIDE HCL 5 MG PO TABS: 5.0000 mg | ORAL_TABLET | Freq: Three times a day (TID) | ORAL | Status: DC | PRN

## 2022-11-26 MED ORDER — DEXAMETHASONE SODIUM PHOSPHATE 10 MG/ML IJ SOLN
INTRAMUSCULAR | Status: AC
  Filled 2022-11-26: qty 1

## 2022-11-26 MED ORDER — FENTANYL CITRATE (PF) 250 MCG/5ML IJ SOLN
INTRAMUSCULAR | Status: AC
  Filled 2022-11-26: qty 5

## 2022-11-26 MED ORDER — OXYCODONE HCL 5 MG PO TABS
5.0000 mg | ORAL_TABLET | ORAL | Status: DC | PRN
  Administered 2022-11-26 – 2022-11-27 (×2): 15 mg via ORAL
  Administered 2022-11-28 (×2): 10 mg via ORAL
  Filled 2022-11-26: qty 2
  Filled 2022-11-26 (×2): qty 3
  Filled 2022-11-26: qty 2

## 2022-11-26 MED ORDER — FENTANYL CITRATE (PF) 100 MCG/2ML IJ SOLN
25.0000 ug | INTRAMUSCULAR | Status: DC | PRN
  Administered 2022-11-26 (×3): 50 ug via INTRAVENOUS

## 2022-11-26 MED ORDER — POLYETHYLENE GLYCOL 3350 17 G PO PACK: 17.0000 g | PACK | Freq: Every day | ORAL | Status: DC | PRN

## 2022-11-26 MED ORDER — FENTANYL CITRATE PF 50 MCG/ML IJ SOSY: 100.0000 ug | PREFILLED_SYRINGE | Freq: Once | INTRAMUSCULAR | Status: DC

## 2022-11-26 SURGICAL SUPPLY — 70 items
ADH SKN CLS APL DERMABOND .7 (GAUZE/BANDAGES/DRESSINGS) ×2
APL PRP STRL LF DISP 70% ISPRP (MISCELLANEOUS) ×2
BAG COUNTER SPONGE SURGICOUNT (BAG) ×2 IMPLANT
BAG SPNG CNTER NS LX DISP (BAG) ×1
BIT DRILL CALIBRATED 4.2 (BIT) IMPLANT
BIT DRILL CANN QC 12.8 (BIT) IMPLANT
BIT DRILL SHORT 4.2 (BIT) IMPLANT
BNDG CMPR MED 10X6 ELC LF (GAUZE/BANDAGES/DRESSINGS) ×2
BNDG COHESIVE 4X5 TAN STRL (GAUZE/BANDAGES/DRESSINGS) ×2 IMPLANT
BNDG ELASTIC 4X5.8 VLCR STR LF (GAUZE/BANDAGES/DRESSINGS) ×2 IMPLANT
BNDG ELASTIC 6X10 VLCR STRL LF (GAUZE/BANDAGES/DRESSINGS) IMPLANT
BNDG ELASTIC 6X5.8 VLCR STR LF (GAUZE/BANDAGES/DRESSINGS) ×2 IMPLANT
BRUSH SCRUB EZ PLAIN DRY (MISCELLANEOUS) ×4 IMPLANT
CAP END FOR RFNA/0 STERILE (Anchor) IMPLANT
CHLORAPREP W/TINT 26 (MISCELLANEOUS) ×2 IMPLANT
COVER MAYO STAND STRL (DRAPES) ×2 IMPLANT
COVER SURGICAL LIGHT HANDLE (MISCELLANEOUS) ×4 IMPLANT
DERMABOND ADVANCED .7 DNX12 (GAUZE/BANDAGES/DRESSINGS) ×2 IMPLANT
DRAPE BILATERAL LIMB T (DRAPES) IMPLANT
DRAPE C-ARM 42X72 X-RAY (DRAPES) ×2 IMPLANT
DRAPE C-ARMOR (DRAPES) ×2 IMPLANT
DRAPE HALF SHEET 40X57 (DRAPES) ×4 IMPLANT
DRAPE IMP U-DRAPE 54X76 (DRAPES) ×4 IMPLANT
DRAPE INCISE IOBAN 66X45 STRL (DRAPES) ×2 IMPLANT
DRAPE ORTHO SPLIT 77X108 STRL (DRAPES) ×2
DRAPE SURG 17X23 STRL (DRAPES) ×2 IMPLANT
DRAPE SURG ORHT 6 SPLT 77X108 (DRAPES) ×4 IMPLANT
DRAPE U-SHAPE 47X51 STRL (DRAPES) ×2 IMPLANT
DRESSING MEPILEX FLEX 4X4 (GAUZE/BANDAGES/DRESSINGS) IMPLANT
DRILL BIT CALIBRATED 4.2 (BIT) ×1
DRILL BIT SHORT 4.2 (BIT) ×2
DRSG MEPILEX FLEX 4X4 (GAUZE/BANDAGES/DRESSINGS) ×9
ELECT REM PT RETURN 9FT ADLT (ELECTROSURGICAL) ×1
ELECTRODE REM PT RTRN 9FT ADLT (ELECTROSURGICAL) ×2 IMPLANT
GAUZE SPONGE 4X4 12PLY STRL (GAUZE/BANDAGES/DRESSINGS) ×2 IMPLANT
GLOVE BIO SURGEON STRL SZ 6.5 (GLOVE) ×6 IMPLANT
GLOVE BIO SURGEON STRL SZ7.5 (GLOVE) ×8 IMPLANT
GLOVE BIOGEL PI IND STRL 6.5 (GLOVE) ×2 IMPLANT
GLOVE BIOGEL PI IND STRL 7.5 (GLOVE) ×2 IMPLANT
GOWN STRL REUS W/ TWL LRG LVL3 (GOWN DISPOSABLE) ×4 IMPLANT
GOWN STRL REUS W/TWL LRG LVL3 (GOWN DISPOSABLE) ×2
GUIDEWIRE 3.2X400 (WIRE) IMPLANT
KIT BASIN OR (CUSTOM PROCEDURE TRAY) ×2 IMPLANT
KIT TURNOVER KIT B (KITS) ×2 IMPLANT
NAIL RFNA 10X360 5D BEND STRL (Nail) IMPLANT
PACK ORTHO EXTREMITY (CUSTOM PROCEDURE TRAY) ×2 IMPLANT
PAD ARMBOARD 7.5X6 YLW CONV (MISCELLANEOUS) ×4 IMPLANT
PAD CAST CTTN 4X4 STRL (SOFTGOODS) IMPLANT
PADDING CAST COTTON 4X4 STRL (SOFTGOODS) ×2
PADDING CAST COTTON 6X4 STRL (CAST SUPPLIES) IMPLANT
REAMER ROD DEEP FLUTE 2.5X950 (INSTRUMENTS) IMPLANT
SCREW LOCK HDLS 5X54 NSTRL (Screw) IMPLANT
SCREW LOCK IM 5X36 (Screw) ×2 IMPLANT
SCREW LOCK IM 72X5XLOPRFL NS (Screw) IMPLANT
SCREW LOCK IM LP NAIL 5X78 (Screw) IMPLANT
SCREW LOCK IM NAIL 5X72 (Screw) ×2 IMPLANT
SCREW LOCK IM NL 5X38 (Screw) IMPLANT
SCREW LOCK X25 36X5X IM (Screw) IMPLANT
SPONGE T-LAP 18X18 ~~LOC~~+RFID (SPONGE) ×2 IMPLANT
STAPLER VISISTAT 35W (STAPLE) ×2 IMPLANT
SUT MNCRL AB 3-0 PS2 18 (SUTURE) ×2 IMPLANT
SUT MNCRL AB 3-0 PS2 27 (SUTURE) IMPLANT
SUT VIC AB 0 CT1 27 (SUTURE) ×1
SUT VIC AB 0 CT1 27XBRD ANBCTR (SUTURE) IMPLANT
SUT VIC AB 2-0 CT1 27 (SUTURE) ×2
SUT VIC AB 2-0 CT1 TAPERPNT 27 (SUTURE) IMPLANT
TOWEL GREEN STERILE (TOWEL DISPOSABLE) ×4 IMPLANT
TOWEL GREEN STERILE FF (TOWEL DISPOSABLE) ×2 IMPLANT
TUBE CONNECTING 12X1/4 (SUCTIONS) ×2 IMPLANT
YANKAUER SUCT BULB TIP NO VENT (SUCTIONS) ×2 IMPLANT

## 2022-11-26 NOTE — Progress Notes (Signed)
Per the ED Social Worker Evelena Peat [phone], the mother of the pt and all of her children have been located and arrangements have been made to retreive the children once they are leased from school.

## 2022-11-26 NOTE — Transfer of Care (Signed)
Immediate Anesthesia Transfer of Care Note  Patient: [name]  Procedure(s) Performed: INTRAMEDULLARY (IM) RETROGRADE FEMORAL NAILING (Bilateral)  Patient Location: PACU  Anesthesia Type:General  Level of Consciousness: awake, alert , and oriented  Airway & Oxygen Therapy: Patient Spontanous Breathing and Patient connected to nasal cannula oxygen  Post-op Assessment: Report given to RN and Post -op Vital signs reviewed and stable  Post vital signs: Reviewed and stable  Last Vitals:  Vitals Value Taken Time  BP 94/75 11/26/22 1530  Temp    Pulse 75 11/26/22 1533  Resp 21 11/26/22 1533  SpO2 100 % 11/26/22 1533  Vitals shown include unvalidated device data.  Last Pain:  Vitals:   11/26/22 1202  TempSrc: Axillary  PainSc: 7       Patients Stated Pain Goal: 2 ([id number])  Complications: No notable events documented.

## 2022-11-26 NOTE — Progress Notes (Signed)
RT at bedside for conscious sedation. Vitals stable throughout.
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
## 2022-11-26 NOTE — ED Notes (Signed)
Ortho at bedside assisting wrapping pts legs as needed to get her to the OR.

## 2022-11-26 NOTE — Progress Notes (Signed)
Pt. agreed to the two person visitor rule only Tammy Price(mother) and Gerre Couch (sister) allowed at this time Password: [phone]
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
## 2022-11-26 NOTE — ED Notes (Signed)
Pt transported to Short Stay after CT , report  given to Short Stay RN
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
## 2022-11-26 NOTE — ED Notes (Addendum)
Transported to CT 

## 2022-11-26 NOTE — Progress Notes (Signed)
Unable to contact family to get consent for surgery.
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
## 2022-11-26 NOTE — ED Notes (Signed)
X-ray at bedside

## 2022-11-26 NOTE — ED Notes (Signed)
Jeffereis, PA with ortho at bedside.

## 2022-11-26 NOTE — Anesthesia Procedure Notes (Signed)
Procedure Name: Intubation Date/Time: 11/26/2022 1:34 PM  Performed by: Mariea Clonts, CRNAPre-anesthesia Checklist: Patient identified, Emergency Drugs available, Suction available and Patient being monitored Patient Re-evaluated:Patient Re-evaluated prior to induction Oxygen Delivery Method: Circle System Utilized Preoxygenation: Pre-oxygenation with 100% oxygen Induction Type: IV induction Ventilation: Mask ventilation without difficulty Laryngoscope Size: Miller and 2 Grade View: Grade I Tube type: Oral Tube size: 7.0 mm Number of attempts: 1 Airway Equipment and Method: Stylet and Oral airway Placement Confirmation: ETT inserted through vocal cords under direct vision, positive ETCO2 and breath sounds checked- equal and bilateral Tube secured with: Tape Dental Injury: Teeth and Oropharynx as per pre-operative assessment
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
## 2022-11-27 LAB — CBC
HCT: 29.6 % — ABNORMAL LOW (ref 36.0–46.0)
Hemoglobin: 9.6 g/dL — ABNORMAL LOW (ref 12.0–15.0)
MCH: 29.7 pg (ref 26.0–34.0)
MCHC: 32.4 g/dL (ref 30.0–36.0)
MCV: 91.6 fL (ref 80.0–100.0)
Platelets: 174 10*3/uL (ref 150–400)
RBC: 3.23 MIL/uL — ABNORMAL LOW (ref 3.87–5.11)
RDW: 12.4 % (ref 11.5–15.5)
WBC: 11.7 10*3/uL — ABNORMAL HIGH (ref 4.0–10.5)
nRBC: 0 % (ref 0.0–0.2)

## 2022-11-27 LAB — BASIC METABOLIC PANEL
Anion gap: 8 (ref 5–15)
BUN: 9 mg/dL (ref 6–20)
CO2: 22 mmol/L (ref 22–32)
Calcium: 8.5 mg/dL — ABNORMAL LOW (ref 8.9–10.3)
Chloride: 106 mmol/L (ref 98–111)
Creatinine, Ser: 0.78 mg/dL (ref 0.44–1.00)
GFR, Estimated: >60 mL/min (ref >60)
Glucose, Bld: 145 mg/dL — ABNORMAL HIGH (ref 70–99)
Potassium: 4 mmol/L (ref 3.5–5.1)
Sodium: 136 mmol/L (ref 135–145)

## 2022-11-27 LAB — LACTIC ACID, PLASMA: Lactic Acid, Venous: 1.1 mmol/L (ref 0.5–1.9)

## 2022-11-27 MED ORDER — KETOROLAC TROMETHAMINE 15 MG/ML IJ SOLN
15.0000 mg | Freq: Four times a day (QID) | INTRAMUSCULAR | Status: DC
  Administered 2022-11-27 – 2022-11-28 (×5): 15 mg via INTRAVENOUS
  Filled 2022-11-27 (×4): qty 1

## 2022-11-27 MED ORDER — MORPHINE SULFATE (PF) 2 MG/ML IV SOLN
2.0000 mg | INTRAVENOUS | Status: DC | PRN
  Administered 2022-11-27 (×2): 2 mg via INTRAVENOUS
  Filled 2022-11-27 (×2): qty 1

## 2022-11-27 NOTE — Progress Notes (Signed)
Inpatient Rehab Admissions Coordinator Note:   Per therapy recommendations patient was screened for CIR candidacy by Michel Santee, PT. At this time, pt appears to be a potential candidate for CIR. I will place an order for rehab consult for full assessment, per our protocol.  Please contact me any with questions.Shann Medal, PT, DPT [phone] 11/27/22 3:44 PM

## 2022-11-27 NOTE — Plan of Care (Signed)
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
## 2022-11-27 NOTE — Anesthesia Postprocedure Evaluation (Signed)
Anesthesia Post Note  Patient: [name]  Procedure(s) Performed: INTRAMEDULLARY (IM) RETROGRADE FEMORAL NAILING (Bilateral)     Patient location during evaluation: PACU Anesthesia Type: General Level of consciousness: awake and alert Pain management: pain level controlled Vital Signs Assessment: post-procedure vital signs reviewed and stable Respiratory status: spontaneous breathing, nonlabored ventilation, respiratory function stable and patient connected to nasal cannula oxygen Cardiovascular status: blood pressure returned to baseline and stable Postop Assessment: no apparent nausea or vomiting Anesthetic complications: no   No notable events documented.  Last Vitals:  Vitals:   11/27/22 0553 11/27/22 0752  BP: 125/82 119/64  Pulse: 71 65  Resp: 18 17  Temp:  36.7 C  SpO2: 100% 100%    Last Pain:  Vitals:   11/27/22 0752  TempSrc: Oral  PainSc:                  [name]
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
## 2022-11-27 NOTE — Plan of Care (Signed)
  Problem: Education: Goal: Knowledge of General Education information will improve Description: Including pain rating scale, medication(s)/side effects and non-pharmacologic comfort measures Outcome: Progressing   Problem: Activity: Goal: Risk for activity intolerance will decrease Outcome: Progressing   Problem: Nutrition: Goal: Adequate nutrition will be maintained Outcome: Progressing   
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
## 2022-11-28 LAB — CBC
HCT: 23.9 % — ABNORMAL LOW (ref 36.0–46.0)
Hemoglobin: 7.9 g/dL — ABNORMAL LOW (ref 12.0–15.0)
MCH: 29.8 pg (ref 26.0–34.0)
MCHC: 33.1 g/dL (ref 30.0–36.0)
MCV: 90.2 fL (ref 80.0–100.0)
Platelets: 164 10*3/uL (ref 150–400)
RBC: 2.65 MIL/uL — ABNORMAL LOW (ref 3.87–5.11)
RDW: 13 % (ref 11.5–15.5)
WBC: 9.3 10*3/uL (ref 4.0–10.5)
nRBC: 0 % (ref 0.0–0.2)

## 2022-11-28 LAB — LACTIC ACID, PLASMA: Lactic Acid, Venous: 1.1 mmol/L (ref 0.5–1.9)
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
## 2022-11-28 NOTE — Progress Notes (Signed)
Inpatient Rehab Admissions Coordinator:    I met with Pt. To discuss potential CIR admit. She states that she feels she just wants to go home. States that she plans to go to her uncle's home and that he and her father are able to provide adequate assistance. States that she needs to be able to go up 5-6 steps but after she is in the home, the bottom level is wheelchair accessible. I spoke with Pt.'s uncle Jeneen Rinks and he states that the family feels that the Pt. Really needs to be with her children and are prepared to take her home as soon as she is medically stable and that they are able to provide all necessary care. He asked if they can get a hospital bed for Pt. Brochure and card left in Pt.'s room and Pt. Instructed to call if she changes her mind.   Clemens Catholic, [place], [place] Admissions Coordinator  [phone] ([place]) [phone] (office)

## 2022-11-28 NOTE — Progress Notes (Signed)
Patient requested to leave AMA. Patient's mother at bedside tried to encourage her to stay but patient still refused. Patient signed AMA paper. IVs removed. Patient left the floor at 1544. MD notified.

## 2022-11-28 NOTE — Progress Notes (Signed)
Patient's uncle Jeneen Rinks [phone]) wanted to be called to get information about patient's discharge plan, so that he can arrange his house to receive the patient.

## 2022-11-30 ENCOUNTER — Encounter (HOSPITAL_COMMUNITY): Payer: Self-pay | Admitting: Obstetrics and Gynecology
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
## 2022-12-03 LAB — VITAMIN D 25 HYDROXY (VIT D DEFICIENCY, FRACTURES)

## 2023-06-24 ENCOUNTER — Encounter (HOSPITAL_COMMUNITY): Payer: Self-pay | Admitting: *Deleted

## 2023-06-24 ENCOUNTER — Inpatient Hospital Stay (HOSPITAL_COMMUNITY)
Admission: AD | Admit: 2023-06-24 | Discharge: 2023-06-24 | Disposition: A | Discharge status: Home / Self Care | Payer: MEDICAID | Attending: Obstetrics and Gynecology | Admitting: Obstetrics and Gynecology

## 2023-06-24 DIAGNOSIS — Z3202 Encounter for pregnancy test, result negative: Secondary | ICD-10-CM | POA: Insufficient documentation

## 2023-06-24 DIAGNOSIS — N926 Irregular menstruation, unspecified: Secondary | ICD-10-CM | POA: Insufficient documentation

## 2023-06-24 LAB — POCT PREGNANCY, URINE: Preg Test, Ur: NEGATIVE

## 2023-06-24 NOTE — Progress Notes (Signed)
Dr Leanora Cover in Triage to see pt and discuss plan of care and d/c plan. Pt then d/c home by Dr Leanora Cover
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
## 2023-06-24 NOTE — MAU Note (Addendum)
.  [name] is a 32 y.o. at Unknown here in MAU reporting thinking she is pregnant because she has not had her period in couple months and can see her abdomen moving. She does not feel movement but sees it. Had some spotting about 4 days ago. Had a normal BM last night. No pain. Pt did upt and was negative.  LMP: unknown but several months Onset of complaint: few days Pain score: 0 Vitals:   06/24/23 1925 06/24/23 1926  BP:  121/88  Pulse: 80   Resp: 17   Temp: 97.9 F (36.6 C)   SpO2: 100%      FHT:n/a Lab orders placed from triage:none

## 2023-10-13 NOTE — L&D Delivery Note (Addendum)
 Delivery Note Pt transferred by EMS to L&D after unplanned delivery at home. Reports feeling like she had to have a BM and delivering baby on the toilet. EMS reports than mom and baby seem to be doing well when they arrived soon after. Placenta delivered spontaneously and appeared intact. Small amount of bleeding prior to arrival at hospital. VSS.   At approximately 4:25 PM a viable female was delivered via Vaginal, Spontaneous (Presentation: vertex ).  APGAR: unknown; weight 5 lb 12.1 oz (2610 g).   Placenta inspected buy CNM, Intact. Membranes appear meconium-stained. Cord: 3 vessels with the following complications:  None.  Cord pH: NA. Perineum inspected. Superficial abrasions hemostatic. No repair indicated.    Anesthesia: None Episiotomy: None Lacerations: None Suture Repair: not indicated Est. Blood Loss (mL): Unavailable. ~100 ml in room    Mom to postpartum.  Baby to Couplet care / Skin to Skin. Placenta to: LD Feeding: Br/Bo Circ: NA Contraception: OP Mirena   Limited prenatal care 2 visits at [facility]. Recommend UDS. Declined. Informed pt of hospital drug testing policy and SW consult.   [name]  [name] 05/25/2024, 6:38 PM

## 2024-01-08 ENCOUNTER — Inpatient Hospital Stay (HOSPITAL_COMMUNITY)
Admission: AD | Admit: 2024-01-08 | Discharge: 2024-01-08 | Disposition: A | Discharge status: Home / Self Care | Payer: MEDICAID | Attending: Obstetrics & Gynecology | Admitting: Obstetrics & Gynecology

## 2024-01-08 ENCOUNTER — Inpatient Hospital Stay (HOSPITAL_BASED_OUTPATIENT_CLINIC_OR_DEPARTMENT_OTHER): Payer: Self-pay

## 2024-01-08 DIAGNOSIS — Z3201 Encounter for pregnancy test, result positive: Secondary | ICD-10-CM | POA: Diagnosis not present

## 2024-01-08 DIAGNOSIS — Z3A17 17 weeks gestation of pregnancy: Secondary | ICD-10-CM

## 2024-01-08 DIAGNOSIS — O99892 Other specified diseases and conditions complicating childbirth: Secondary | ICD-10-CM

## 2024-01-08 DIAGNOSIS — O26892 Other specified pregnancy related conditions, second trimester: Secondary | ICD-10-CM

## 2024-01-08 DIAGNOSIS — O23592 Infection of other part of genital tract in pregnancy, second trimester: Secondary | ICD-10-CM | POA: Insufficient documentation

## 2024-01-08 DIAGNOSIS — B9689 Other specified bacterial agents as the cause of diseases classified elsewhere: Secondary | ICD-10-CM | POA: Diagnosis not present

## 2024-01-08 DIAGNOSIS — R109 Unspecified abdominal pain: Secondary | ICD-10-CM | POA: Diagnosis present

## 2024-01-08 DIAGNOSIS — O0932 Supervision of pregnancy with insufficient antenatal care, second trimester: Secondary | ICD-10-CM

## 2024-01-08 DIAGNOSIS — Z3490 Encounter for supervision of normal pregnancy, unspecified, unspecified trimester: Secondary | ICD-10-CM

## 2024-01-08 LAB — CBC
HCT: 33 % — ABNORMAL LOW (ref 36.0–46.0)
Hemoglobin: 10.8 g/dL — ABNORMAL LOW (ref 12.0–15.0)
MCH: 30.1 pg (ref 26.0–34.0)
MCHC: 32.7 g/dL (ref 30.0–36.0)
MCV: 91.9 fL (ref 80.0–100.0)
Platelets: 206 10*3/uL (ref 150–400)
RBC: 3.59 MIL/uL — ABNORMAL LOW (ref 3.87–5.11)
RDW: 12.3 % (ref 11.5–15.5)
WBC: 5.8 10*3/uL (ref 4.0–10.5)
nRBC: 0 % (ref 0.0–0.2)

## 2024-01-08 LAB — URINALYSIS, ROUTINE W REFLEX MICROSCOPIC
Bilirubin Urine: NEGATIVE
Glucose, UA: NEGATIVE mg/dL
Hgb urine dipstick: NEGATIVE
Ketones, ur: NEGATIVE mg/dL
Leukocytes,Ua: NEGATIVE
Nitrite: NEGATIVE
Protein, ur: NEGATIVE mg/dL
Specific Gravity, Urine: 1.026 (ref 1.005–1.030)
pH: 5 (ref 5.0–8.0)

## 2024-01-08 LAB — WET PREP, GENITAL
Sperm: NONE SEEN
Trich, Wet Prep: NONE SEEN
WBC, Wet Prep HPF POC: <10 (ref <10)
Yeast Wet Prep HPF POC: NONE SEEN

## 2024-01-08 LAB — POCT PREGNANCY, URINE: Preg Test, Ur: POSITIVE — AB

## 2024-01-08 MED ORDER — METRONIDAZOLE 500 MG PO TABS: 500.0000 mg | ORAL_TABLET | Freq: Two times a day (BID) | ORAL | 0 refills | Status: AC

## 2024-01-08 NOTE — MAU Provider Note (Signed)
 History     CSN: [id number]  Arrival date and time: 01/08/24 1715   Event Date/Time   First Provider Initiated Contact with Patient    Chief Complaint  Patient presents with   Possible Pregnancy   Abdominal Pain    HPI  [name] is a 33 y.o. [phone] at [redacted]w[redacted]d who presents to the MAU for lower abdominal cramping x 5 days. She found out she was pregnant recently 3-4 days ago and was concerned about how far along she was/if pregnancy was healthy. Has not established OB care yet. No VB.   Past Medical History:  Diagnosis Date   Anxiety    Depression    Sickle cell trait (HCC)     Past Surgical History:  Procedure Laterality Date   FEMUR IM NAIL Bilateral 11/26/2022   Procedure: INTRAMEDULLARY (IM) RETROGRADE FEMORAL NAILING;  Surgeon: Roby Lofts, MD;  Location: [facility] OR;  Service: Orthopedics;  Laterality: Bilateral;   NO PAST SURGERIES     WISDOM TOOTH EXTRACTION      Family History  Problem Relation Age of Onset   Bipolar disorder Mother    Schizophrenia Mother    Drug abuse Mother    Alcohol abuse Mother    Hypertension Mother    Drug abuse Father    Anesthesia problems Neg Hx    Hypotension Neg Hx    Malignant hyperthermia Neg Hx    Pseudochol deficiency Neg Hx     Social History   Tobacco Use   Smoking status: Never   Smokeless tobacco: Never  Vaping Use   Vaping status: Never Used  Substance Use Topics   Alcohol use: Not Currently   Drug use: Never    Types: Other-see comments, Marijuana, Heroin    Allergies: No Known Allergies  No medications prior to admission.    ROS reviewed and pertinent positives and negatives as documented in HPI.  Physical Exam   Blood pressure 119/73, pulse 92, temperature 98.5 F (36.9 C), temperature source Oral, resp. rate 18, height 5\' 5"  (1.651 m), weight 64 kg, SpO2 100%, unknown if currently breastfeeding.  Physical Exam Constitutional:      General: She is not in acute distress.    Appearance:  Normal appearance. She is not ill-appearing.  HENT:     Head: Normocephalic and atraumatic.  Cardiovascular:     Rate and Rhythm: Normal rate.  Pulmonary:     Effort: Pulmonary effort is normal.     Breath sounds: Normal breath sounds.  Abdominal:     Palpations: Abdomen is soft.     Tenderness: There is no abdominal tenderness. There is no guarding.  Musculoskeletal:        General: Normal range of motion.  Skin:    General: Skin is warm and dry.     Findings: No rash.  Neurological:     General: No focal deficit present.     Mental Status: She is alert and oriented to person, place, and time.     MAU Course  Procedures  MDM 33 y.o. [id number] at [redacted]w[redacted]d here with lower abdominal pain/cramping. Her abdominal exam was benign. Wet prep concerning for BV. UA and CBC both wnl. U/S prelim report -- GA [redacted]w[redacted]d. Discussed results in detail w pt. Suspect some of her discomfort may be from BV, but would also consider round ligament pain if symptoms persist. Message sent to [facility] to assist pt in scheduling NOB appt. Return precautions discussed. Stable for d/c.  Assessment and  Plan  Intrauterine pregnancy - Plans to establish OB care with [facility], message sent to assist w scheduling  BV (bacterial vaginosis) - Rx for Flagyl sent   Sundra Aland, MD OB Fellow, Faculty Practice [facility], Center for [facility]  01/08/24, 8:37 PM

## 2024-01-08 NOTE — MAU Note (Signed)
.  [name] is a 33 y.o. at Unknown here in MAU reporting: post HPT 3-4 days ago. She reports lower abdominal cramping that started 5 days ago.  Denies VB or abnormal discharge.   She is here to see how far along she is as her breast have recently started leaking.  LMP: Maybe November or December of last year Onset of complaint: On-going Pain score: 3/10 Vitals:   01/08/24 1734  BP: 119/73  Pulse: 92  Resp: 18  Temp: 98.5 F (36.9 C)  SpO2: 100%     Lab orders placed from triage: UA preg

## 2024-01-10 LAB — GC/CHLAMYDIA PROBE AMP (~~LOC~~) NOT AT ARMC
Chlamydia: NEGATIVE
Comment: NEGATIVE
Comment: NORMAL
Neisseria Gonorrhea: NEGATIVE

## 2024-01-13 ENCOUNTER — Encounter: Payer: Self-pay | Admitting: *Deleted

## 2024-01-13 ENCOUNTER — Other Ambulatory Visit: Payer: Self-pay | Admitting: *Deleted

## 2024-01-13 DIAGNOSIS — Z3480 Encounter for supervision of other normal pregnancy, unspecified trimester: Secondary | ICD-10-CM

## 2024-01-14 ENCOUNTER — Ambulatory Visit (INDEPENDENT_AMBULATORY_CARE_PROVIDER_SITE_OTHER): Payer: Self-pay | Admitting: *Deleted

## 2024-01-14 ENCOUNTER — Telehealth: Payer: Self-pay | Admitting: *Deleted

## 2024-01-14 DIAGNOSIS — Z3480 Encounter for supervision of other normal pregnancy, unspecified trimester: Secondary | ICD-10-CM

## 2024-01-14 DIAGNOSIS — Z3A18 18 weeks gestation of pregnancy: Secondary | ICD-10-CM

## 2024-01-14 DIAGNOSIS — O0990 Supervision of high risk pregnancy, unspecified, unspecified trimester: Secondary | ICD-10-CM | POA: Insufficient documentation

## 2024-01-14 DIAGNOSIS — Z3482 Encounter for supervision of other normal pregnancy, second trimester: Secondary | ICD-10-CM

## 2024-01-14 MED ORDER — BLOOD PRESSURE KIT DEVI: 1.0000 | 0 refills | Status: AC

## 2024-01-14 MED ORDER — VITAFOL GUMMIES 3.33-0.333-34.8 MG PO CHEW: 1.0000 | CHEWABLE_TABLET | Freq: Every day | ORAL | 5 refills | Status: AC

## 2024-01-14 NOTE — Progress Notes (Cosign Needed Addendum)
 New OB Intake  I connected with [name]  on 01/14/24 at  9:15 AM EDT by In Person Visit and verified that I am speaking with the correct person using two identifiers. Nurse is located at [facility] and pt is located at Home.  I discussed the limitations, risks, security and privacy concerns of performing an evaluation and management service by telephone and the availability of in person appointments. I also discussed with the patient that there may be a patient responsible charge related to this service. The patient expressed understanding and agreed to proceed.  I explained I am completing New OB Intake today. We discussed EDD of 06/13/2024, by Ultrasound. Pt is [id number]. I reviewed her allergies, medications and Medical/Surgical/OB history.    Patient Active Problem List   Diagnosis Date Noted   Closed fracture of right femur (HCC) 11/26/2022   Trichomoniasis 02/18/2022   Anxiety 04/28/2019   Healthcare maintenance 04/28/2019   Depression    Encounter for IUD insertion 04/23/2017   Low vitamin D level 03/02/2017   Low backache 02/07/2013    Concerns addressed today  Delivery Plans Plans to deliver at [facility]. Discussed the nature of our practice with multiple providers including residents and students. Due to the size of the practice, the delivering provider may not be the same as those providing prenatal care.   Patient is interested in water birth. Offered upcoming OB visit with CNM to discuss further.  MyChart/Babyscripts MyChart access verified. I explained pt will have some visits in office and some virtually. Babyscripts instructions given and order placed. Patient verifies receipt of registration text/e-mail. Account successfully created and app downloaded. If patient is a candidate for Optimized scheduling, add to sticky note.   Blood Pressure Cuff/Weight Scale Blood pressure cuff ordered for patient to pick-up from [employer]. Explained after first prenatal appt  pt will check weekly and document in Babyscripts. Patient does not have weight scale; patient may purchase if they desire to track weight weekly in Babyscripts.  Anatomy US Explained first scheduled [place] will be around 19 weeks. Anatomy US scheduled for 02/08/24 at 7 AM.  Interested in [place]? If yes, send referral and doula dot phrase.   Is patient a candidate for Babyscripts Optimization? No, due to Risk factors   First visit review I reviewed new OB appt with patient. Explained pt will be seen by Clement Sayres, PA at first visit. Discussed Avelina Laine genetic screening with patient. Requests Panorama. Routine prenatal labs  not collected/ virtual visit.    Last Pap Diagnosis  Date Value Ref Range Status  08/18/2016   Final   NEGATIVE FOR INTRAEPITHELIAL LESIONS OR MALIGNANCY.    Harrel Lemon, RN 01/14/2024  9:56 AM

## 2024-01-14 NOTE — Patient Instructions (Signed)

## 2024-01-14 NOTE — Telephone Encounter (Signed)
 TC to complete OB Intake. No answer. LVM on mobile number with call back number. Home number, call can not be completed.

## 2024-01-17 NOTE — Progress Notes (Deleted)
 PRENATAL VISIT NOTE  Subjective:  [name] is a 33 y.o. [phone] at [redacted]w[redacted]d being seen today for her first prenatal visit for this pregnancy.  She is currently monitored for the following issues for this {Blank single:19197::"high-risk","low-risk"} pregnancy and has Low backache; Low vitamin D level; Encounter for IUD insertion; Depression; Anxiety; Healthcare maintenance; Trichomoniasis; Closed fracture of right femur (HCC); and Supervision of high risk pregnancy, antepartum on their problem list.  Patient reports {sx:14538}.   .  .   . Denies leaking of fluid.   She is planning to {Blank single:19197::"breastfeed","bottle feed"}. Desires *** for contraception.   The following portions of the patient's history were reviewed and updated as appropriate: allergies, current medications, past family history, past medical history, past social history, past surgical history and problem list.   Objective:  There were no vitals filed for this visit.  Fetal Status:           General:  Alert, oriented and cooperative. Patient is in no acute distress.  Skin: Skin is warm and dry. No rash noted.   Cardiovascular: Normal heart rate and rhythm noted  Respiratory: Normal respiratory effort, no problems with respiration noted. Clear to auscultation.   Abdomen: Soft, gravid, appropriate for gestational age. Normal bowel sounds. Non-tender.       Pelvic: {Blank single:19197::"Cervical exam performed","Cervical exam deferred"}       Normal cervical contour, no lesions, no bleeding following pap, normal discharge  Extremities: Normal range of motion.     Mental Status: Normal mood and affect. Normal behavior. Normal judgment and thought content.    Indications for ASA therapy (per uptodate) One of the following: Previous pregnancy with preeclampsia, especially early onset and with an adverse outcome {yes/no:20286} Multifetal gestation {yes/no:20286} Chronic hypertension {yes/no:20286} Type 1  or 2 diabetes mellitus {yes/no:20286} Chronic kidney disease {yes/no:20286} Autoimmune disease (antiphospholipid syndrome, systemic lupus erythematosus) {yes/no:20286}  Two or more of the following: Nulliparity No Obesity (body mass index >30 kg/m2) {yes/no:20286} Family history of preeclampsia in mother or sister {yes/no:20286} Age >=35 years No Sociodemographic characteristics (African American race, low socioeconomic level) Yes Personal risk factors (eg, previous pregnancy with low birth weight or small for gestational age infant, previous adverse pregnancy outcome [eg, stillbirth], interval >10 years between pregnancies) {yes/no:20286}   Assessment and Plan:  Pregnancy: G5P3013 at [redacted]w[redacted]d  1. Supervision of high risk pregnancy, antepartum (Primary) Initial labs drawn. Continue prenatal vitamins. Genetic Screening discussed: NIPS, carrier screening and AFP *** Ultrasound discussed; fetal anatomic survey: scheduled 01/29/24 Problem list reviewed and updated. Reviewed Brx optimized schedule, patient agreeable The nature of [facility] Faculty Practice with multiple MDs and other Advanced Practice Providers was explained to patient; also emphasized that residents, students are part of our team. Routine obstetric precautions reviewed.   2. [redacted] weeks gestation of pregnancy Anticipatory guidance about next visits/weeks of pregnancy given.   Preterm labor/first trimester warning symptoms and general obstetric precautions including but not limited to vaginal bleeding, contractions, leaking of fluid and fetal movement were reviewed in detail with the patient. Please refer to After Visit Summary for other counseling recommendations.   No follow-ups on file.  Future Appointments  Date Time Provider Department Center  01/19/2024  2:50 PM Ralene Muskrat, [place] [facility] None  02/04/2024  8:00 AM [facility] NURSE INTAKE [facility]  02/08/2024  7:00 AM [facility] PROVIDER 1 [facility]   02/08/2024  7:30 AM [facility] US6 [facility]    Ralene Muskrat, PA-C

## 2024-01-19 ENCOUNTER — Encounter: Payer: Self-pay | Admitting: Physician Assistant

## 2024-01-19 DIAGNOSIS — Z3A19 19 weeks gestation of pregnancy: Secondary | ICD-10-CM

## 2024-01-19 DIAGNOSIS — O0990 Supervision of high risk pregnancy, unspecified, unspecified trimester: Secondary | ICD-10-CM

## 2024-01-20 IMAGING — US US OB < 14 WEEKS - US OB TV
1 series · 15 of 28 positions shown · non-contrast
Comparison: None Available.

CLINICAL DATA: Pelvic pain, vaginal spotting

EXAM:
OBSTETRIC <14 WK US AND TRANSVAGINAL OB US
TECHNIQUE: Both transabdominal and transvaginal ultrasound examinations were
performed for complete evaluation of the gestation as well as the
maternal uterus, adnexal regions, and pelvic cul-de-sac.
Transvaginal technique was performed to assess early pregnancy.

[Series 1: us ob < 14 weeks - us ob tv · 62 acquisitions, 15 frames shown]
[im 1/62]
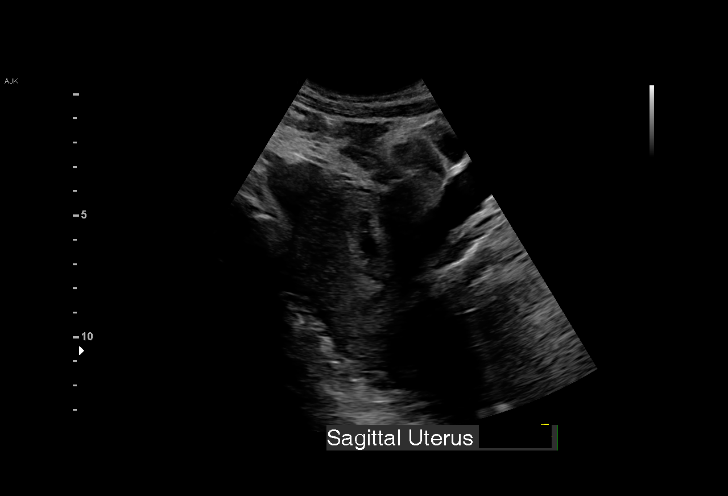
[im 5/62]
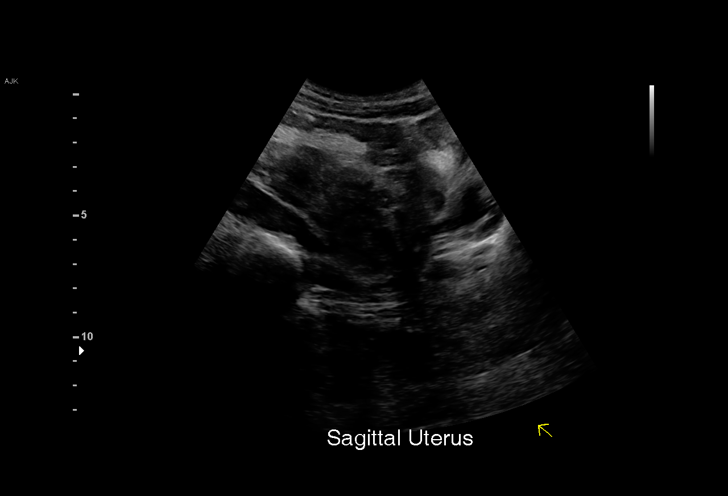
[im 10/62]
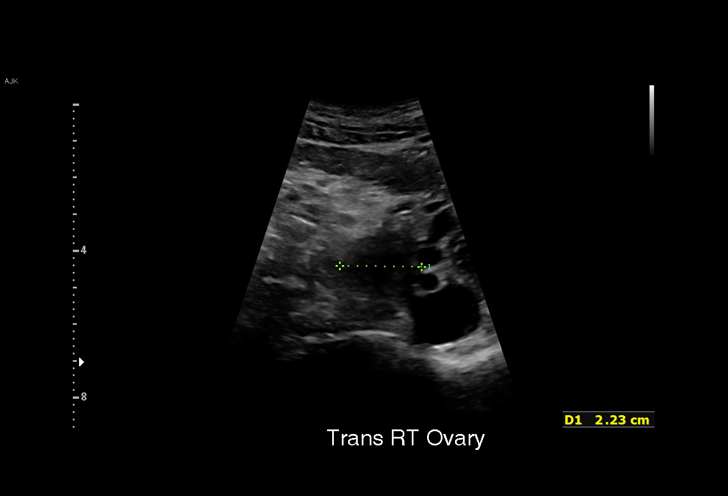
[im 14/62]
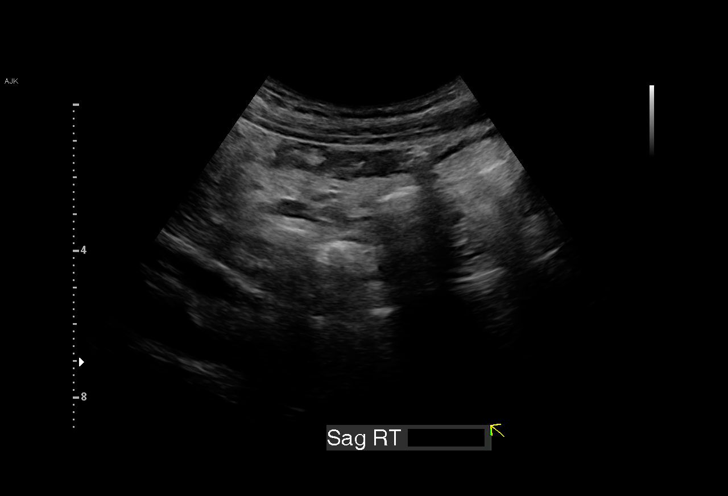
[im 19/62]
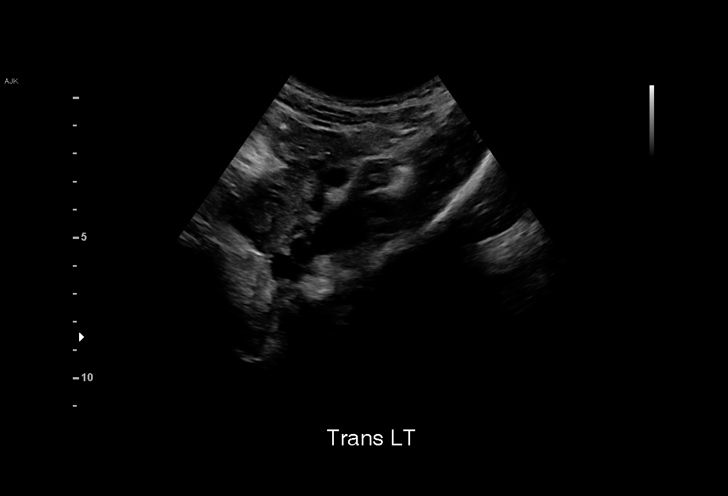
[im 23/62]
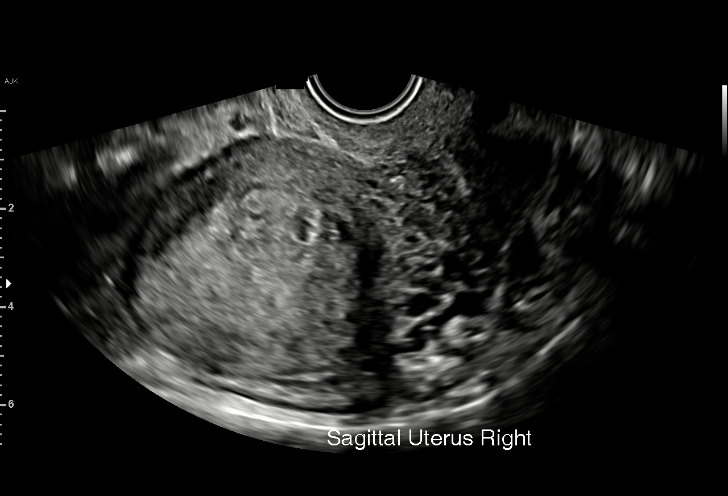
[im 28/62]
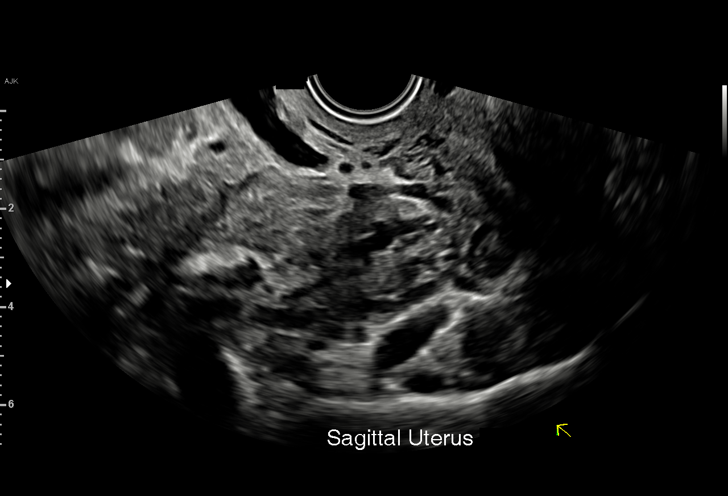
[im 32/62]
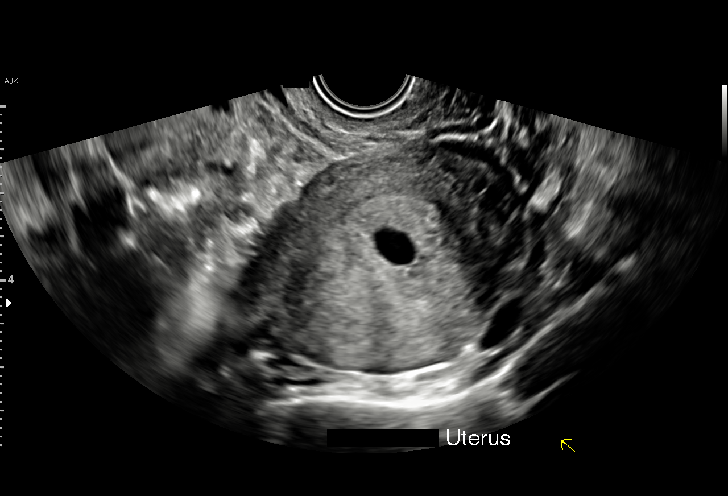
[im 34/62]
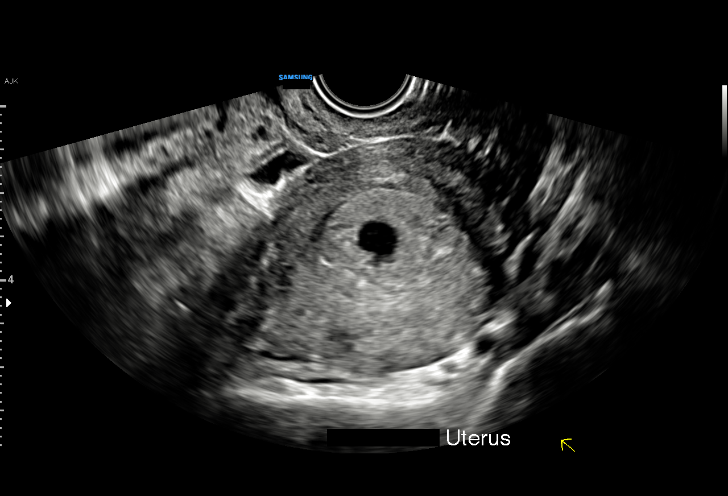
[im 39/62]
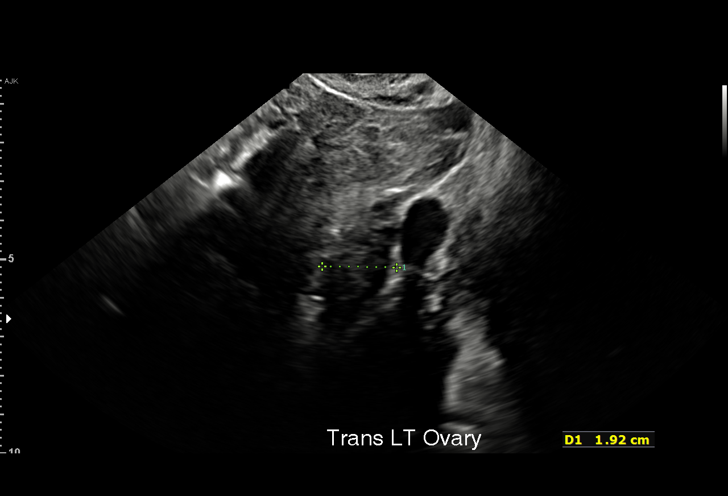
[im 43/62]
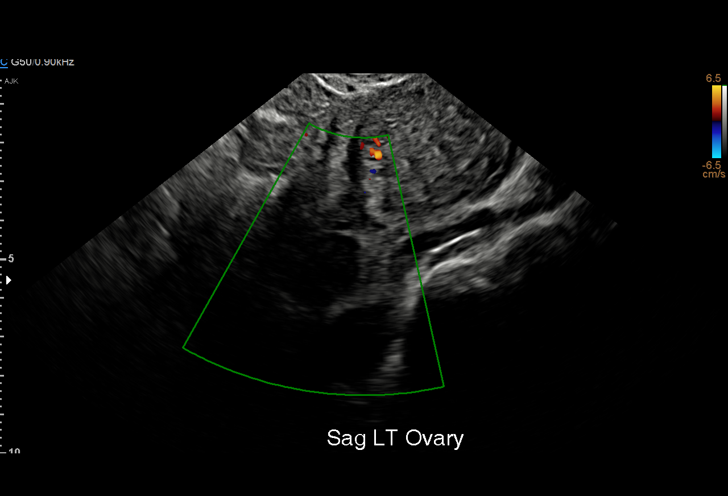
[im 48/62]
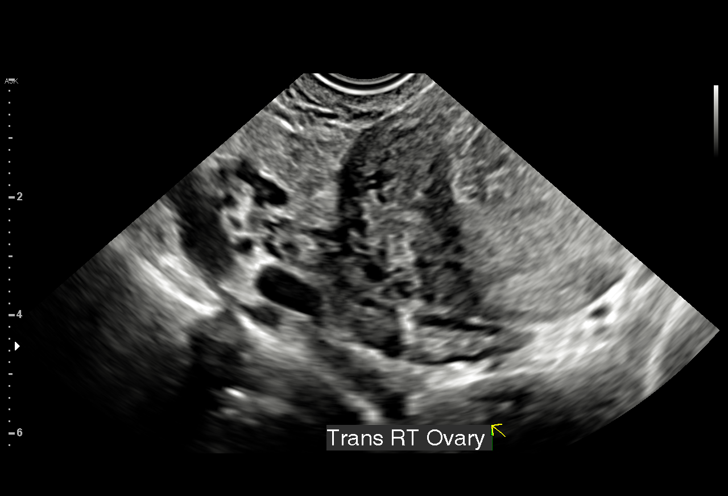
[im 52/62]
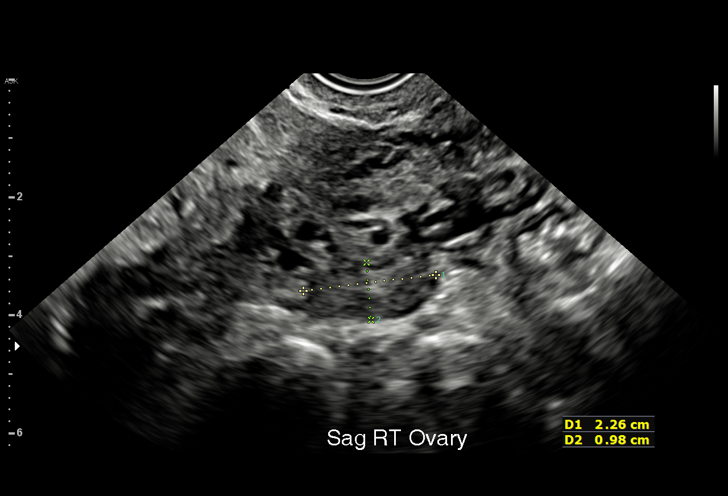
[im 57/62]
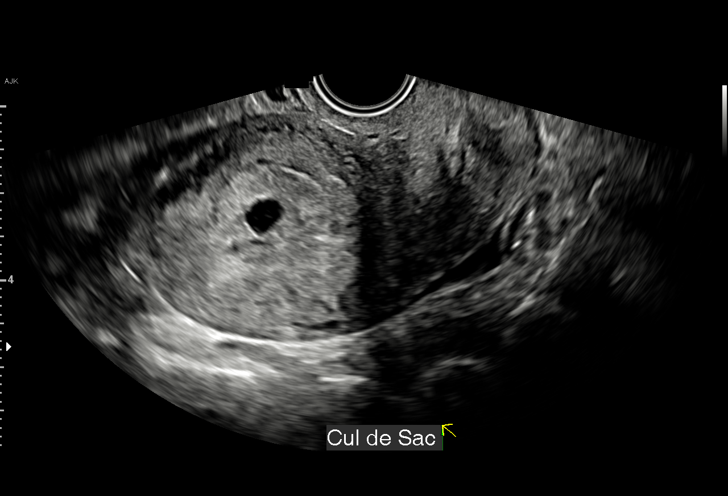
[im 62/62]
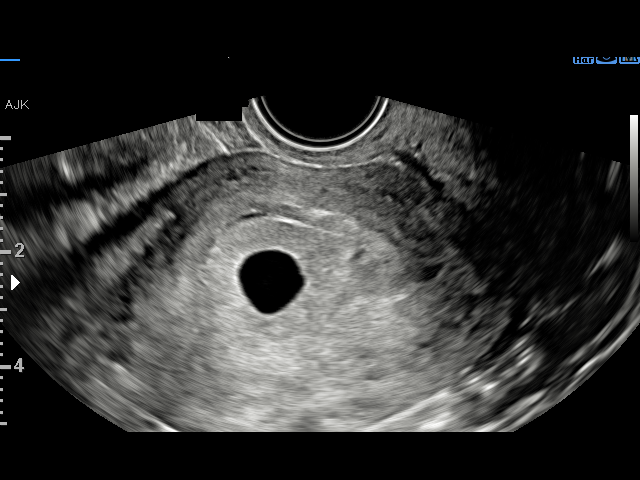

[15 of 28 positions shown; findings below may reference images not displayed]

FINDINGS: Intrauterine gestational sac: Single

Yolk sac:  Not seen

Embryo:  Not seen

Cardiac Activity: Not seen

MSD: 11.6 mm   5 w   6 d

Subchorionic hemorrhage:  There is possible trace amount.

Maternal uterus/adnexae: Cervix is closed. There are no dominant
adnexal masses. There is no free fluid in the pelvis.
IMPRESSION: There is a gestational sac without yolk sac or fetal pole within the
fundus of the uterus. By mean sac diameter measurement, estimated
gestational age is 5 weeks 6 days. Findings suggest very early
normal IUP or failed gestation with incomplete abortion. Serial HCG
estimations and follow-up sonogram in 2 weeks may be considered.

There are no dominant adnexal masses. There is no free fluid in the
pelvis.

## 2024-02-04 ENCOUNTER — Telehealth: Payer: Self-pay

## 2024-02-06 IMAGING — US US OB TRANSVAGINAL
1 series · 15 of 23 positions shown · non-contrast
Comparison: None Available.
COMPARISON: Obstetrical ultrasound 02/18/2022.
COMPARISON: None Available.

Addendum:
CLINICAL DATA: Cramping and bleeding.

EXAM:
TRANSVAGINAL OB ULTRASOUND
TECHNIQUE: Transvaginal ultrasound was performed for complete evaluation of the
gestation as well as the maternal uterus, adnexal regions, and
pelvic cul-de-sac.

[Series 1: us ob transvaginal · 23 acquisitions, 15 frames shown]
[im 1/23]
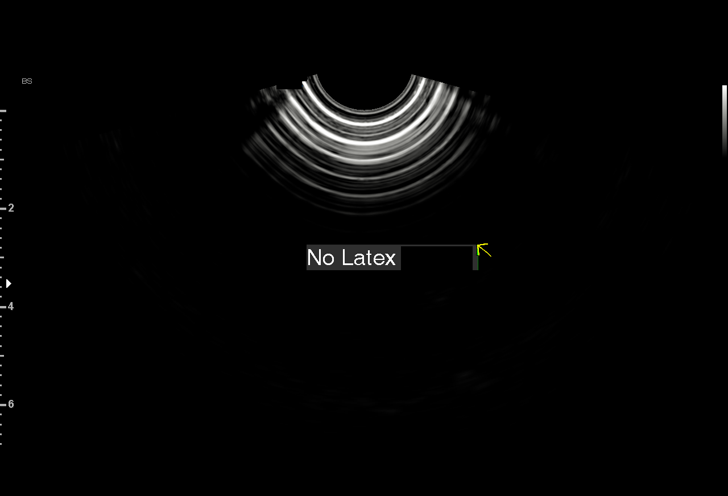
[im 3/23]
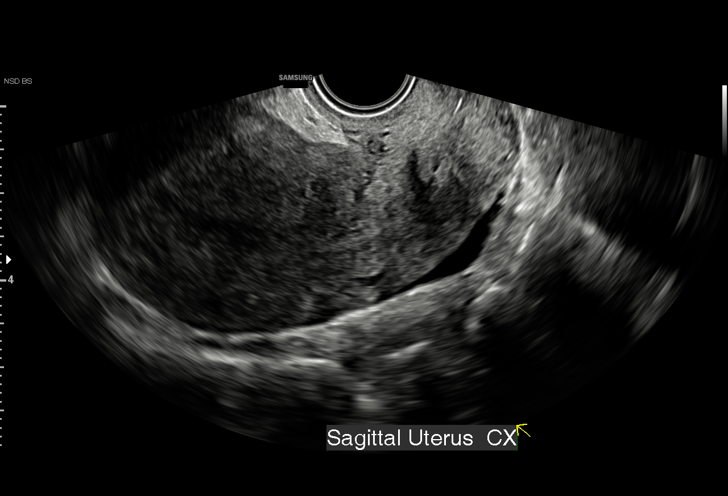
[im 4/23]
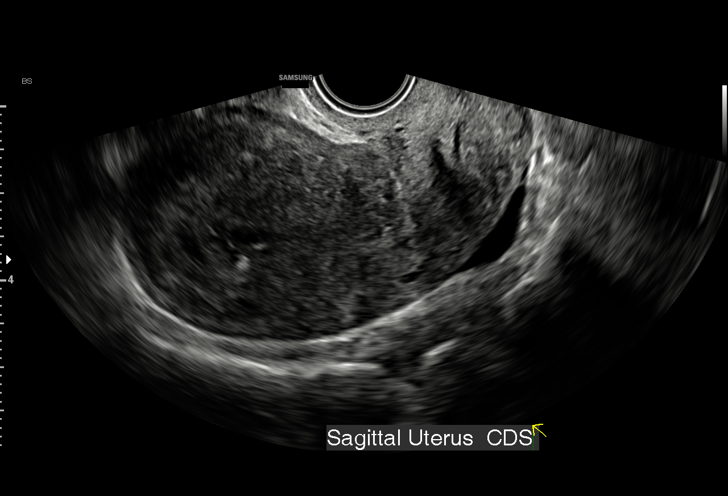
[im 6/23]
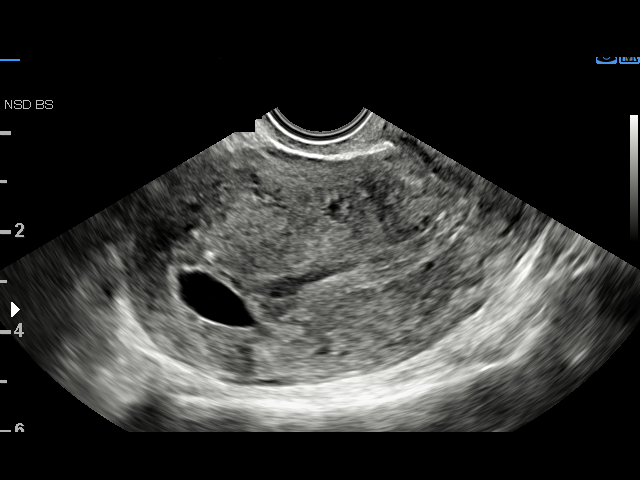
[im 7/23]
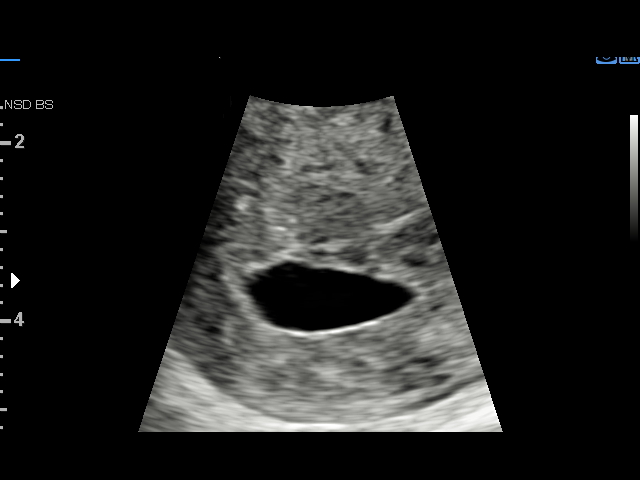
[im 9/23]
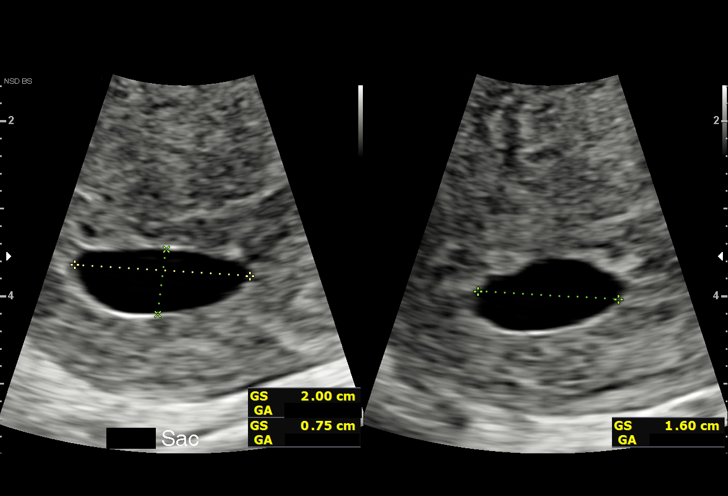
[im 10/23]
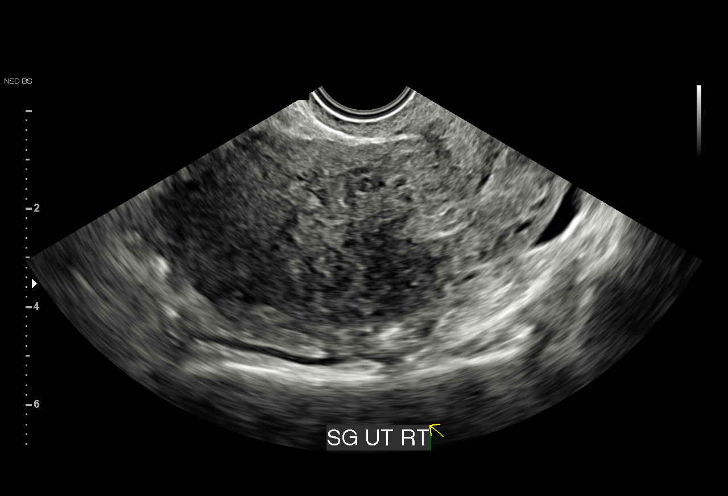
[im 12/23]
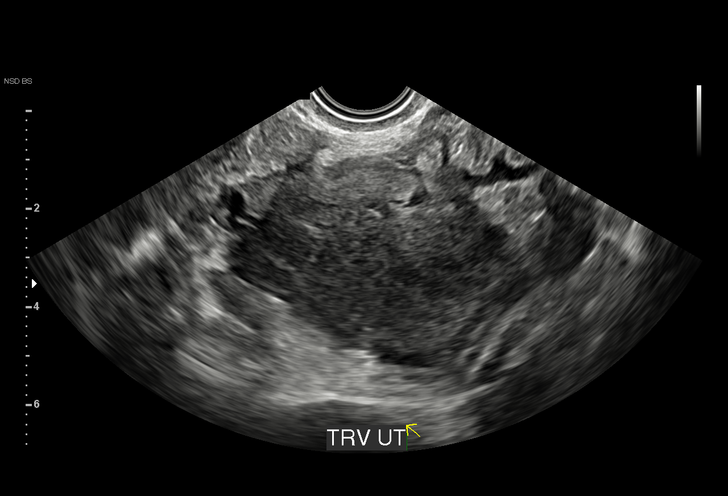
[im 14/23]
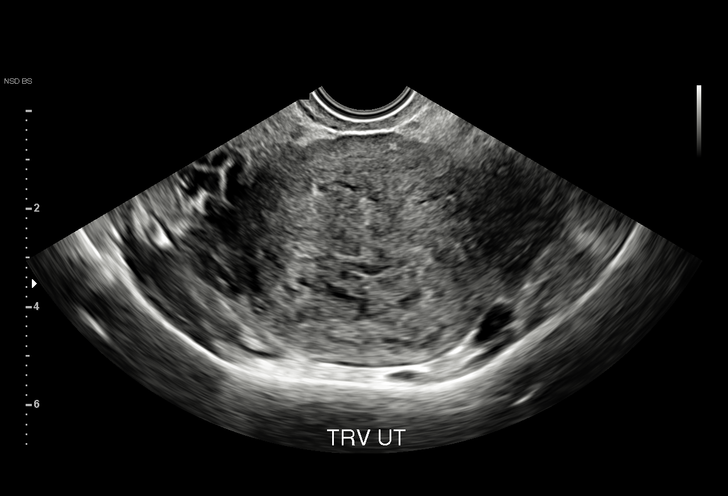
[im 15/23]
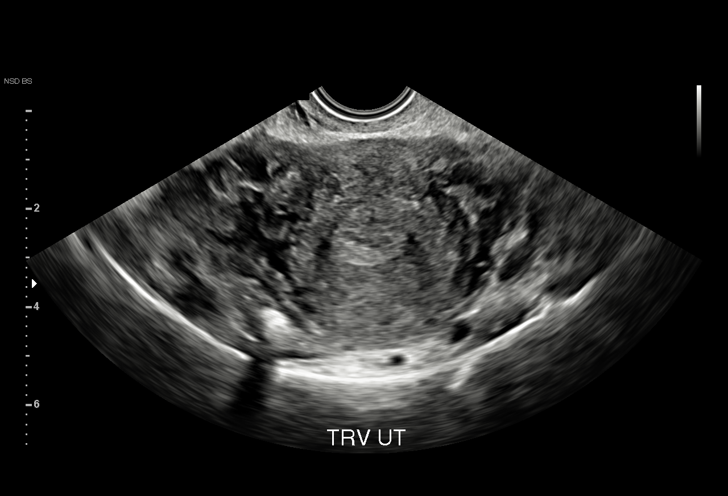
[im 17/23]
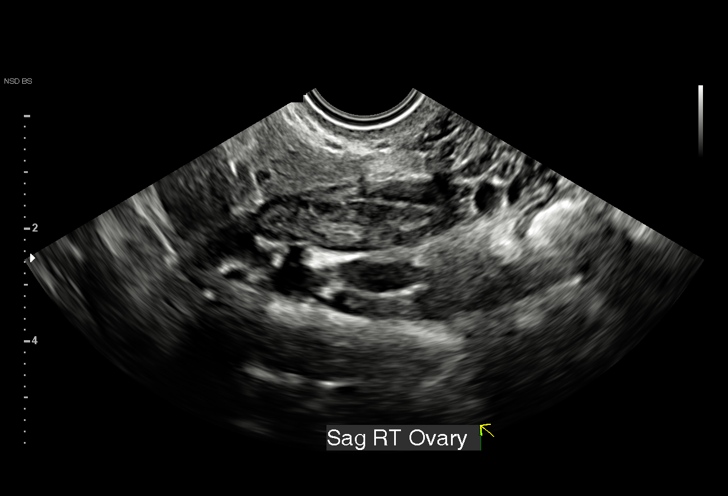
[im 18/23]
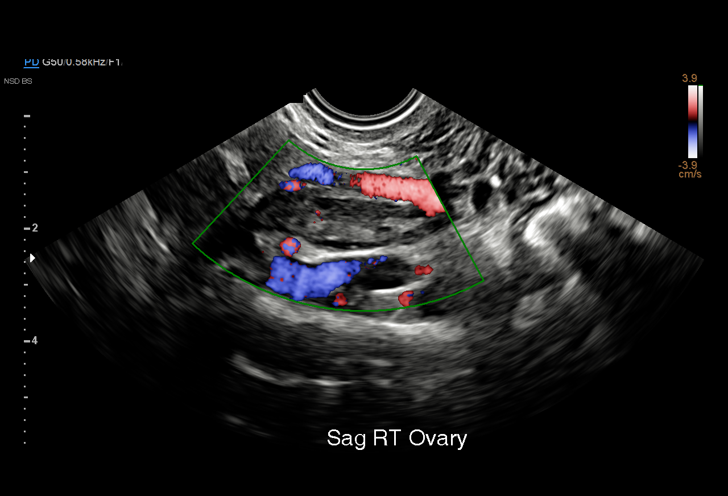
[im 20/23]
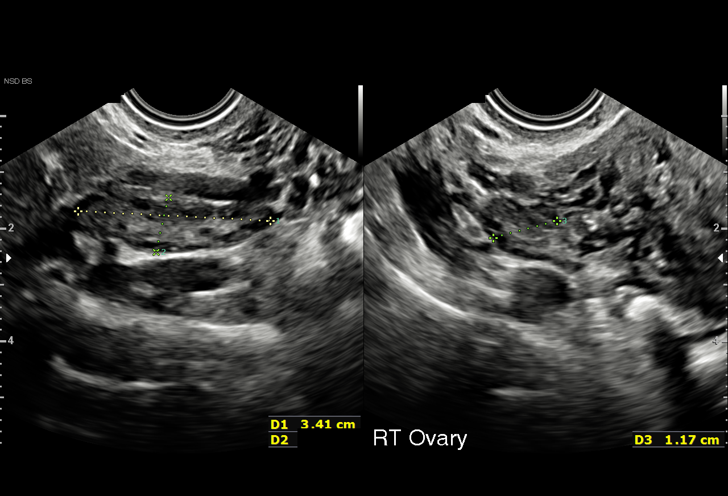
[im 21/23]
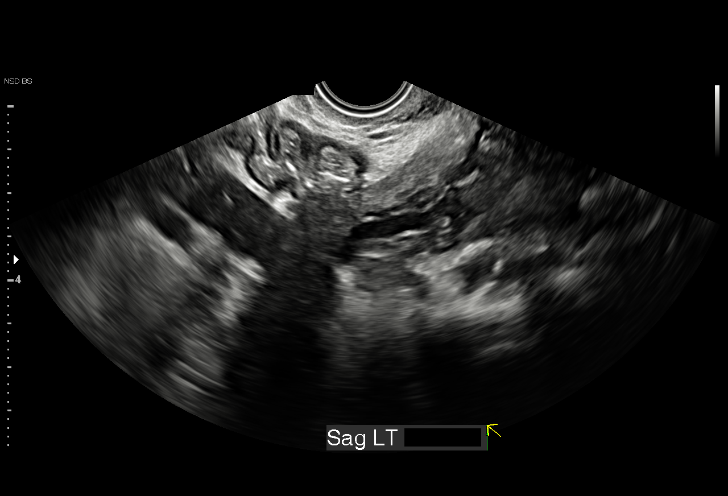
[im 23/23]
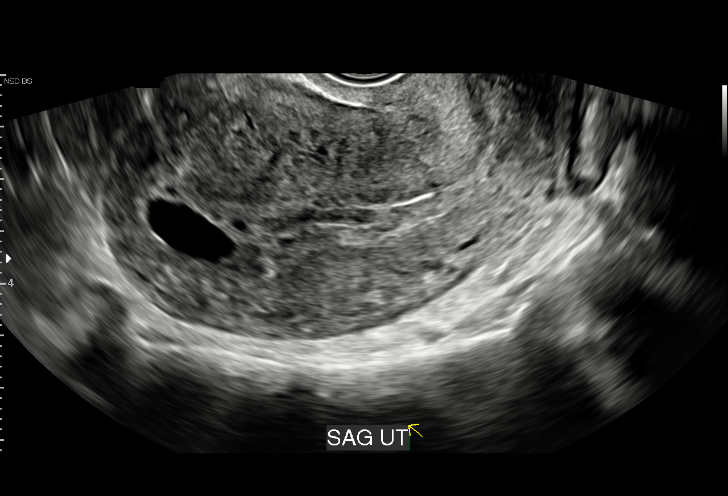

[15 of 23 positions shown; findings below may reference images not displayed]

FINDINGS: Intrauterine gestational sac: Single

Yolk sac:  Not Visualized.

Embryo:  Not Visualized.

MSD: 14.5 mm   6 w   2 d

Subchorionic hemorrhage:  None visualized.

Maternal uterus/adnexae: Right ovary appears within normal limits.
Left ovary not visualized. Trace free fluid in the pelvis.
IMPRESSION: 1. Probable early intrauterine gestational sac, but no yolk sac,
fetal pole, or cardiac activity yet visualized. Recommend follow-up
quantitative B-HCG levels and follow-up US in 14 days to assess
viability. This recommendation follows SRU consensus guidelines:
Diagnostic Criteria for Nonviable Pregnancy Early in the First
Trimester. N Engl J Med 0552; [DATE].
2. Trace free fluid in the pelvis.
IMPRESSION: 1. Again seen is empty gestational sac without interval appropriate
growth. Findings are compatible with failed intrauterine pregnancy.

These results were called by telephone at the time of interpretation
acknowledged these results.

*** End of Addendum ***
FINDINGS: Intrauterine gestational sac: Single

Yolk sac:  Not Visualized.

Embryo:  Not Visualized.

MSD: 14.5 mm   6 w   2 d

Subchorionic hemorrhage:  None visualized.

Maternal uterus/adnexae: Right ovary appears within normal limits.
Left ovary not visualized. Trace free fluid in the pelvis.
IMPRESSION: 1. Probable early intrauterine gestational sac, but no yolk sac,
fetal pole, or cardiac activity yet visualized. Recommend follow-up
quantitative B-HCG levels and follow-up US in 14 days to assess
viability. This recommendation follows SRU consensus guidelines:
Diagnostic Criteria for Nonviable Pregnancy Early in the First
Trimester. N Engl J Med 0552; [DATE].
2. Trace free fluid in the pelvis.

## 2024-02-08 ENCOUNTER — Ambulatory Visit: Payer: MEDICAID

## 2024-03-09 ENCOUNTER — Ambulatory Visit: Payer: MEDICAID

## 2024-03-09 ENCOUNTER — Other Ambulatory Visit: Payer: Self-pay | Admitting: *Deleted

## 2024-03-09 ENCOUNTER — Ambulatory Visit: Payer: MEDICAID | Attending: Obstetrics and Gynecology | Admitting: Obstetrics

## 2024-03-09 ENCOUNTER — Ambulatory Visit (HOSPITAL_BASED_OUTPATIENT_CLINIC_OR_DEPARTMENT_OTHER): Payer: MEDICAID

## 2024-03-09 VITALS — BP 109/57 | HR 97

## 2024-03-09 DIAGNOSIS — O0990 Supervision of high risk pregnancy, unspecified, unspecified trimester: Secondary | ICD-10-CM

## 2024-03-09 DIAGNOSIS — Z3480 Encounter for supervision of other normal pregnancy, unspecified trimester: Secondary | ICD-10-CM

## 2024-03-09 DIAGNOSIS — Z362 Encounter for other antenatal screening follow-up: Secondary | ICD-10-CM

## 2024-03-09 DIAGNOSIS — O365930 Maternal care for other known or suspected poor fetal growth, third trimester, not applicable or unspecified: Secondary | ICD-10-CM | POA: Diagnosis present

## 2024-03-09 DIAGNOSIS — O0932 Supervision of pregnancy with insufficient antenatal care, second trimester: Secondary | ICD-10-CM | POA: Diagnosis not present

## 2024-03-09 DIAGNOSIS — Z3A26 26 weeks gestation of pregnancy: Secondary | ICD-10-CM | POA: Insufficient documentation

## 2024-03-09 DIAGNOSIS — Z3482 Encounter for supervision of other normal pregnancy, second trimester: Secondary | ICD-10-CM

## 2024-03-09 DIAGNOSIS — O365990 Maternal care for other known or suspected poor fetal growth, unspecified trimester, not applicable or unspecified: Secondary | ICD-10-CM

## 2024-03-09 NOTE — Progress Notes (Addendum)
 MFM Consult Note  [name] is currently at 26 weeks and 2 days.  She was seen as she presented late for prenatal care.   She denies any problems in her current pregnancy.    She had an ultrasound performed in the MAU earlier in her current pregnancy which indicated an [facility] of June 13, 2024.  She has not had a screening test for fetal aneuploidy drawn in her current pregnancy.    On today's exam, the EFW of 1 pound 11 ounces measures at the 6th percentile for her gestational age indicating IUGR.    There was normal amniotic fluid noted today.    Fetal movements were noted throughout today's exam.    Doppler studies of the umbilical arteries showed a normal S/D ratio of 3.27 .  There were no signs of absent or reversed end-diastolic flow.    The views of the fetal anatomy were limited today due to the fetal position.  What was visualized today appeared within normal limits.  Due to IUGR, she will return in 2 weeks for an umbilical artery Doppler study and NST.  We will reassess the fetal growth again in 3 weeks.    As the patient has not had a screening test for fetal aneuploidy, she had the Panorama cell free DNA test drawn following today's ultrasound exam.  Our genetic counselor will notify the patient regarding the results of this test.    She stated that all of her questions were answered today.  A total of 30 minutes was spent counseling and coordinating the care for this patient.  Greater than 50% of the time was spent in direct face-to-face contact.

## 2024-03-14 LAB — PANORAMA PRENATAL TEST FULL PANEL:PANORAMA TEST PLUS 5 ADDITIONAL MICRODELETIONS: FETAL FRACTION: 12.1

## 2024-03-15 ENCOUNTER — Ambulatory Visit: Payer: Self-pay

## 2024-04-07 ENCOUNTER — Other Ambulatory Visit: Payer: Self-pay | Admitting: *Deleted

## 2024-04-07 ENCOUNTER — Ambulatory Visit (HOSPITAL_BASED_OUTPATIENT_CLINIC_OR_DEPARTMENT_OTHER): Payer: MEDICAID

## 2024-04-07 ENCOUNTER — Ambulatory Visit: Payer: MEDICAID

## 2024-04-07 ENCOUNTER — Ambulatory Visit: Payer: MEDICAID | Attending: Obstetrics and Gynecology | Admitting: Obstetrics and Gynecology

## 2024-04-07 VITALS — BP 112/67 | HR 87

## 2024-04-07 DIAGNOSIS — Z148 Genetic carrier of other disease: Secondary | ICD-10-CM | POA: Diagnosis not present

## 2024-04-07 DIAGNOSIS — O365990 Maternal care for other known or suspected poor fetal growth, unspecified trimester, not applicable or unspecified: Secondary | ICD-10-CM

## 2024-04-07 DIAGNOSIS — Z362 Encounter for other antenatal screening follow-up: Secondary | ICD-10-CM | POA: Insufficient documentation

## 2024-04-07 DIAGNOSIS — O99013 Anemia complicating pregnancy, third trimester: Secondary | ICD-10-CM

## 2024-04-07 DIAGNOSIS — Z3A30 30 weeks gestation of pregnancy: Secondary | ICD-10-CM | POA: Diagnosis not present

## 2024-04-07 DIAGNOSIS — O365930 Maternal care for other known or suspected poor fetal growth, third trimester, not applicable or unspecified: Secondary | ICD-10-CM | POA: Insufficient documentation

## 2024-04-07 DIAGNOSIS — Z8759 Personal history of other complications of pregnancy, childbirth and the puerperium: Secondary | ICD-10-CM

## 2024-04-07 DIAGNOSIS — D573 Sickle-cell trait: Secondary | ICD-10-CM | POA: Diagnosis not present

## 2024-04-07 DIAGNOSIS — O99213 Obesity complicating pregnancy, third trimester: Secondary | ICD-10-CM | POA: Diagnosis not present

## 2024-04-07 DIAGNOSIS — E6689 Other obesity not elsewhere classified: Secondary | ICD-10-CM | POA: Diagnosis not present

## 2024-04-07 DIAGNOSIS — O0990 Supervision of high risk pregnancy, unspecified, unspecified trimester: Secondary | ICD-10-CM

## 2024-04-07 NOTE — Progress Notes (Signed)
 Maternal-Fetal Medicine Consultation Name: [name] MRN: [id number]  G5 P3013 at 30w 3d gestation.  Patient return for fetal growth assessment.  On previous fetal growth assessment, the estimated fetal weight was at the 6th percentile and the abdominal circumference measurement at the 5th percentile.  Patient has not had prenatal care in this pregnancy.  She is yet to screen for gestational diabetes.  Blood pressure today at our office is 112/67 mmHg.  Ultrasound Ultrasound fetal growth is appropriate for gestational age.  The estimated fetal weight is at the 16th percentile and the abdominal circumference measurement is at the 38th percentile.  Amniotic fluid normal good fetal activity seen  I strongly encouraged the patient to make a prenatal visit appointment.  Inadequate prenatal care can lead to fetal and maternal adverse outcomes.  I encouraged her to screen for gestational diabetes.  I discussed the significance of sickle cell trait.  She has a new partner, and I encouraged her to screen her partner for sickle cell trait.  If he is a carrier then there is a 1 in 4 chance of the fetus being affected with sickle cell disease.   Recommendations - An appointment was made for her to return in 4 weeks for fetal growth assessment. -I encouraged the patient to make a prenatal visit appointment.   Consultation including face-to-face (more than 50%) counseling 20 minutes.

## 2024-04-12 ENCOUNTER — Encounter: Payer: MEDICAID | Admitting: Family Medicine

## 2024-04-18 ENCOUNTER — Encounter: Payer: MEDICAID | Admitting: Obstetrics & Gynecology

## 2024-04-18 DIAGNOSIS — O0990 Supervision of high risk pregnancy, unspecified, unspecified trimester: Secondary | ICD-10-CM

## 2024-04-18 DIAGNOSIS — Z3A31 31 weeks gestation of pregnancy: Secondary | ICD-10-CM

## 2024-04-19 ENCOUNTER — Encounter: Payer: MEDICAID | Admitting: Obstetrics

## 2024-04-21 ENCOUNTER — Ambulatory Visit (INDEPENDENT_AMBULATORY_CARE_PROVIDER_SITE_OTHER): Payer: MEDICAID | Admitting: Obstetrics & Gynecology

## 2024-04-21 ENCOUNTER — Encounter: Payer: Self-pay | Admitting: Obstetrics & Gynecology

## 2024-04-21 ENCOUNTER — Other Ambulatory Visit (HOSPITAL_COMMUNITY)
Admission: RE | Admit: 2024-04-21 | Discharge: 2024-04-21 | Disposition: A | Discharge status: Home / Self Care | Payer: MEDICAID | Source: Ambulatory Visit | Attending: Obstetrics & Gynecology | Admitting: Obstetrics & Gynecology

## 2024-04-21 VITALS — BP 103/68 | HR 76 | Wt 158.0 lb

## 2024-04-21 DIAGNOSIS — Z3A32 32 weeks gestation of pregnancy: Secondary | ICD-10-CM | POA: Diagnosis not present

## 2024-04-21 DIAGNOSIS — O0993 Supervision of high risk pregnancy, unspecified, third trimester: Secondary | ICD-10-CM

## 2024-04-21 DIAGNOSIS — O0990 Supervision of high risk pregnancy, unspecified, unspecified trimester: Secondary | ICD-10-CM | POA: Diagnosis present

## 2024-04-21 NOTE — Progress Notes (Signed)
 Subjective:late prenatal care    [name] is a [id number] [redacted]w[redacted]d being seen today for her first obstetrical visit.  Her obstetrical history is significant for non-compliance. Patient does intend to breast feed. Pregnancy history fully reviewed.  Patient reports no complaints.  Vitals:   04/21/24 1027  BP: 103/68  Pulse: 76  Weight: 158 lb (71.7 kg)    HISTORY: OB History  Gravida Para Term Preterm AB Living  5 3 3  0 1 3  SAB IAB Ectopic Multiple Live Births  1 0 0 0 3    # Outcome Date GA Lbr Len/2nd Weight Sex Type Anes PTL Lv  5 Current           4 SAB 2023     SAB     3 Term 03/09/17 [redacted]w[redacted]d 03:38 / 00:10 6 lb 10.9 oz (3.03 kg) F Vag-Spont EPI  LIV  2 Term 09/06/12 [redacted]w[redacted]d 24:00 / 00:02 6 lb 10.8 oz (3.028 kg) F Vag-Spont EPI  LIV  1 Term 10/13/11 [redacted]w[redacted]d 20:13 / 02:34 7 lb 6.7 oz (3.365 kg) M Vag-Spont EPI  LIV     Birth Comments: Caput on head   Past Medical History:  Diagnosis Date   Anxiety    Closed fracture of right femur (HCC) 11/26/2022   Depression    PTSD (post-traumatic stress disorder)    Sickle cell trait (HCC)    Past Surgical History:  Procedure Laterality Date   FEMUR IM NAIL Bilateral 11/26/2022   Procedure: INTRAMEDULLARY (IM) RETROGRADE FEMORAL NAILING;  Surgeon: Kendal Franky SQUIBB, MD;  Location: [facility] OR;  Service: Orthopedics;  Laterality: Bilateral;   WISDOM TOOTH EXTRACTION     Family History  Problem Relation Age of Onset   Bipolar disorder Mother    Schizophrenia Mother    Drug abuse Mother    Alcohol abuse Mother    Hypertension Mother    Drug abuse Father    Anesthesia problems Neg Hx    Hypotension Neg Hx    Malignant hyperthermia Neg Hx    Pseudochol deficiency Neg Hx      Exam    Uterus:   32 cm  Pelvic Exam: Requests deferral   Perineum:    Vulva:    Vagina:     pH:    Cervix:    Adnexa:    Bony Pelvis:   System: Breast:  Inspection negative   Skin: normal coloration and turgor, no rashes    Neurologic: oriented,  normal mood   Extremities: normal strength, tone, and muscle mass   HEENT PERRLA   Mouth/Teeth dental hygiene good   Neck supple   Cardiovascular: regular rate and rhythm, no murmurs or gallops   Respiratory:  appears well, vitals normal, no respiratory distress, acyanotic, normal RR, chest clear, no wheezing, crepitations, rhonchi, normal symmetric air entry   Abdomen: Gravid c/w dates   Urinary:       Assessment:    Pregnancy: H4E6986 Patient Active Problem List   Diagnosis Date Noted   Supervision of high risk pregnancy, antepartum 01/14/2024   Trichomoniasis 02/18/2022   Anxiety 04/28/2019   Depression         Plan:     Initial labs drawn. Prenatal vitamins. Problem list reviewed and updated. Genetic Screening discussed : results reviewed.  Ultrasound discussed; fetal survey: results reviewed.  Follow up in 1 weeks. 50% of 30 min visit spent on counseling and coordination of care.  Needs GTT, asks to defer [facility] to [facility]  Lynwood Solomons 04/21/2024

## 2024-04-22 LAB — CBC/D/PLT+RPR+RH+ABO+RUBIGG...
Antibody Screen: NEGATIVE
Basophils Absolute: 0 x10E3/uL (ref 0.0–0.2)
Basos: 0 %
EOS (ABSOLUTE): 0.1 x10E3/uL (ref 0.0–0.4)
Eos: 1 %
HCV Ab: NONREACTIVE
HIV Screen 4th Generation wRfx: NONREACTIVE
Hematocrit: 32.7 % — ABNORMAL LOW (ref 34.0–46.6)
Hemoglobin: 10.5 g/dL — ABNORMAL LOW (ref 11.1–15.9)
Hepatitis B Surface Ag: NEGATIVE
Immature Grans (Abs): 0 x10E3/uL (ref 0.0–0.1)
Immature Granulocytes: 0 %
Lymphocytes Absolute: 1.5 x10E3/uL (ref 0.7–3.1)
Lymphs: 21 %
MCH: 30 pg (ref 26.6–33.0)
MCHC: 32.1 g/dL (ref 31.5–35.7)
MCV: 93 fL (ref 79–97)
Monocytes Absolute: 0.7 x10E3/uL (ref 0.1–0.9)
Monocytes: 10 %
Neutrophils Absolute: 4.8 x10E3/uL (ref 1.4–7.0)
Neutrophils: 68 %
Platelets: 235 x10E3/uL (ref 150–450)
RBC: 3.5 x10E6/uL — ABNORMAL LOW (ref 3.77–5.28)
RDW: 13 % (ref 11.7–15.4)
RPR Ser Ql: NONREACTIVE
Rh Factor: POSITIVE
Rubella Antibodies, IGG: 0.97 {index} — ABNORMAL LOW (ref >0.99)
WBC: 7.1 x10E3/uL (ref 3.4–10.8)

## 2024-04-22 LAB — HEMOGLOBIN A1C
Est. average glucose Bld gHb Est-mCnc: 105 mg/dL
Hgb A1c MFr Bld: 5.3 % (ref 4.8–5.6)

## 2024-04-22 LAB — HCV INTERPRETATION

## 2024-04-24 LAB — CERVICOVAGINAL ANCILLARY ONLY
Chlamydia: NEGATIVE
Comment: NEGATIVE
Comment: NEGATIVE
Comment: NORMAL
Neisseria Gonorrhea: NEGATIVE
Trichomonas: POSITIVE — AB

## 2024-04-27 DIAGNOSIS — Z8759 Personal history of other complications of pregnancy, childbirth and the puerperium: Secondary | ICD-10-CM | POA: Insufficient documentation

## 2024-04-28 ENCOUNTER — Ambulatory Visit (INDEPENDENT_AMBULATORY_CARE_PROVIDER_SITE_OTHER): Payer: MEDICAID | Admitting: Certified Nurse Midwife

## 2024-04-28 ENCOUNTER — Other Ambulatory Visit: Payer: MEDICAID

## 2024-04-28 ENCOUNTER — Encounter: Payer: Self-pay | Admitting: Certified Nurse Midwife

## 2024-04-28 VITALS — BP 105/66 | HR 81 | Wt 160.8 lb

## 2024-04-28 DIAGNOSIS — O0993 Supervision of high risk pregnancy, unspecified, third trimester: Secondary | ICD-10-CM | POA: Diagnosis not present

## 2024-04-28 DIAGNOSIS — F1991 Other psychoactive substance use, unspecified, in remission: Secondary | ICD-10-CM

## 2024-04-28 DIAGNOSIS — O0930 Supervision of pregnancy with insufficient antenatal care, unspecified trimester: Secondary | ICD-10-CM

## 2024-04-28 DIAGNOSIS — Z3A33 33 weeks gestation of pregnancy: Secondary | ICD-10-CM

## 2024-04-28 DIAGNOSIS — A5901 Trichomonal vulvovaginitis: Secondary | ICD-10-CM

## 2024-04-28 DIAGNOSIS — O23593 Infection of other part of genital tract in pregnancy, third trimester: Secondary | ICD-10-CM

## 2024-04-28 NOTE — Progress Notes (Signed)
 Declines T-Dap today. Doing GTT today

## 2024-04-29 ENCOUNTER — Ambulatory Visit: Payer: Self-pay | Admitting: Certified Nurse Midwife

## 2024-04-29 DIAGNOSIS — O24419 Gestational diabetes mellitus in pregnancy, unspecified control: Secondary | ICD-10-CM

## 2024-04-29 DIAGNOSIS — Z3A33 33 weeks gestation of pregnancy: Secondary | ICD-10-CM

## 2024-04-29 DIAGNOSIS — A5901 Trichomonal vulvovaginitis: Secondary | ICD-10-CM | POA: Insufficient documentation

## 2024-04-29 LAB — GLUCOSE TOLERANCE, 2 HOURS W/ 1HR
Glucose, 1 hour: 161 mg/dL (ref 70–179)
Glucose, 2 hour: 91 mg/dL (ref 70–152)
Glucose, Fasting: 116 mg/dL — ABNORMAL HIGH (ref 70–91)

## 2024-04-29 MED ORDER — TINIDAZOLE 500 MG PO TABS: 2.0000 g | ORAL_TABLET | Freq: Once | ORAL | 1 refills | Status: AC

## 2024-04-29 NOTE — Progress Notes (Signed)
 PRENATAL VISIT NOTE  Subjective:  [name] is a 33 y.o. [phone] at [redacted]w[redacted]d being seen today for ongoing prenatal care.  She is currently monitored for the following issues for this high-risk pregnancy and has Depression; Anxiety; Supervision of high risk pregnancy, antepartum; and History of prior pregnancy with IUGR newborn on their problem list.  Patient reports no complaints.  Contractions: Irritability. Vag. Bleeding: None.  Movement: Increased. Denies leaking of fluid.   The following portions of the patient's history were reviewed and updated as appropriate: allergies, current medications, past family history, past medical history, past social history, past surgical history and problem list.   Objective:    Vitals:   04/28/24 0901  BP: 105/66  Pulse: 81  Weight: 160 lb 12.8 oz (72.9 kg)    Fetal Status:  Fetal Heart Rate (bpm): 142   Movement: Increased    General: Alert, oriented and cooperative. Patient is in no acute distress.  Skin: Skin is warm and dry. No rash noted.   Cardiovascular: Normal heart rate noted  Respiratory: Normal respiratory effort, no problems with respiration noted  Abdomen: Soft, gravid, appropriate for gestational age.  Pain/Pressure: Present     Pelvic: Cervical exam deferred        Extremities: Normal range of motion.  Edema: Mild pitting, slight indentation  Mental Status: Normal mood and affect. Normal behavior. Normal judgment and thought content.   Assessment and Plan:  Pregnancy: H4E6986 at [redacted]w[redacted]d 1. Supervision of high risk pregnancy in third trimester (Primary) - Patient doing well.  - Reports vigorous and frequent fetal movement.  - Previously positive for Trich rx sent to outpatient pharmacy.   2. [redacted] weeks gestation of pregnancy - - Pt interested in waterbirth and has attended the class in previous pregnancy.  - Reviewed conditions in labor that will risk her out of water immersion including thick meconium or blood stained  amniotic fluid, non-reassuring fetal status on monitor, excessive bleeding, hypertension, dizziness, use of IV meds, damaged equipment or staffing that does not allow for water immersion, etc.  - The attending midwife must be on the unit for water immersion to begin; pt understands this may delay the start of water immersion. - Reminded pt that signing consent in labor at the hospital also acknowledges they will exit the tub if the attending midwife requests. - Consent given to patient for review.  Consent will be reviewed and signed at the hospital by the waterbirth provider prior to use of the tub. - Discussed other labor support options if waterbirth becomes unavailable, including position change, freedom of movement, use of birthing ball, and/or use of hydrotherapy in the shower (dependent upon medical condition/provider discretion).  - Glucose Tolerance, 2 Hours w/1 Hour  3. 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 Late prenatal care - GTT today and follow up with MFM scheduled  Preterm labor symptoms and general obstetric precautions including but not limited to vaginal bleeding, contractions, leaking of fluid and fetal movement were reviewed in detail with the patient. Please refer to After Visit Summary for other counseling recommendations.   Return in about 2 weeks (around 05/12/2024) for LOB.  Future Appointments  Date Time Provider Department Center  05/09/2024  1:50 PM Milly Olam LABOR, CNM [facility] None  05/09/2024  2:30 PM [facility] PROVIDER 1 [facility]  05/09/2024  3:00 PM [facility] US1 [facility]    Florence Antonelli Erven) Emilio, MSN, CNM  Center for [facility]  04/29/2024 11:16 AM

## 2024-05-01 ENCOUNTER — Other Ambulatory Visit: Payer: Self-pay

## 2024-05-01 MED ORDER — ACCU-CHEK SOFTCLIX LANCETS MISC: 12 refills | Status: AC

## 2024-05-01 MED ORDER — ACCU-CHEK GUIDE W/DEVICE KIT: 1.0000 | PACK | Freq: Four times a day (QID) | 0 refills | Status: AC

## 2024-05-01 MED ORDER — ACCU-CHEK GUIDE TEST VI STRP: ORAL_STRIP | 12 refills | Status: AC

## 2024-05-09 ENCOUNTER — Encounter: Payer: MEDICAID | Admitting: Obstetrics & Gynecology

## 2024-05-09 ENCOUNTER — Ambulatory Visit: Payer: MEDICAID | Attending: Obstetrics and Gynecology

## 2024-05-09 ENCOUNTER — Encounter: Payer: MEDICAID | Admitting: Advanced Practice Midwife

## 2024-05-09 ENCOUNTER — Ambulatory Visit: Payer: MEDICAID

## 2024-05-09 DIAGNOSIS — Z8759 Personal history of other complications of pregnancy, childbirth and the puerperium: Secondary | ICD-10-CM

## 2024-05-25 ENCOUNTER — Encounter (HOSPITAL_COMMUNITY): Payer: Self-pay | Admitting: Family Medicine

## 2024-05-25 ENCOUNTER — Inpatient Hospital Stay (HOSPITAL_COMMUNITY)
Admission: AD | Admit: 2024-05-25 | Discharge: 2024-05-26 | DRG: 806 | Disposition: A | Discharge status: Home / Self Care | Payer: MEDICAID | Attending: Obstetrics & Gynecology | Admitting: Obstetrics & Gynecology

## 2024-05-25 DIAGNOSIS — A5901 Trichomonal vulvovaginitis: Secondary | ICD-10-CM | POA: Diagnosis present

## 2024-05-25 DIAGNOSIS — O24420 Gestational diabetes mellitus in childbirth, diet controlled: Secondary | ICD-10-CM | POA: Diagnosis present

## 2024-05-25 DIAGNOSIS — O9832 Other infections with a predominantly sexual mode of transmission complicating childbirth: Secondary | ICD-10-CM | POA: Diagnosis not present

## 2024-05-25 DIAGNOSIS — D573 Sickle-cell trait: Secondary | ICD-10-CM | POA: Diagnosis present

## 2024-05-25 DIAGNOSIS — F1410 Cocaine abuse, uncomplicated: Secondary | ICD-10-CM | POA: Diagnosis not present

## 2024-05-25 DIAGNOSIS — O24439 Gestational diabetes mellitus in the puerperium, unspecified control: Principal | ICD-10-CM | POA: Diagnosis present

## 2024-05-25 DIAGNOSIS — O0933 Supervision of pregnancy with insufficient antenatal care, third trimester: Secondary | ICD-10-CM | POA: Diagnosis not present

## 2024-05-25 DIAGNOSIS — O9833 Other infections with a predominantly sexual mode of transmission complicating the puerperium: Secondary | ICD-10-CM | POA: Diagnosis present

## 2024-05-25 DIAGNOSIS — Z3A37 37 weeks gestation of pregnancy: Secondary | ICD-10-CM | POA: Diagnosis not present

## 2024-05-25 DIAGNOSIS — Z8249 Family history of ischemic heart disease and other diseases of the circulatory system: Secondary | ICD-10-CM | POA: Diagnosis not present

## 2024-05-25 DIAGNOSIS — F1490 Cocaine use, unspecified, uncomplicated: Secondary | ICD-10-CM | POA: Diagnosis present

## 2024-05-25 DIAGNOSIS — O99344 Other mental disorders complicating childbirth: Secondary | ICD-10-CM | POA: Diagnosis not present

## 2024-05-25 DIAGNOSIS — O24429 Gestational diabetes mellitus in childbirth, unspecified control: Secondary | ICD-10-CM | POA: Diagnosis not present

## 2024-05-25 DIAGNOSIS — O99324 Drug use complicating childbirth: Secondary | ICD-10-CM | POA: Diagnosis not present

## 2024-05-25 LAB — TYPE AND SCREEN
ABO/RH(D): A POS
Antibody Screen: NEGATIVE

## 2024-05-25 LAB — CBC
HCT: 36.3 % (ref 36.0–46.0)
Hemoglobin: 11.4 g/dL — ABNORMAL LOW (ref 12.0–15.0)
MCH: 29.3 pg (ref 26.0–34.0)
MCHC: 31.4 g/dL (ref 30.0–36.0)
MCV: 93.3 fL (ref 80.0–100.0)
Platelets: 184 K/uL (ref 150–400)
RBC: 3.89 MIL/uL (ref 3.87–5.11)
RDW: 13.2 % (ref 11.5–15.5)
WBC: 12.1 K/uL — ABNORMAL HIGH (ref 4.0–10.5)
nRBC: 0 % (ref 0.0–0.2)

## 2024-05-25 MED ORDER — ONDANSETRON HCL 4 MG/2ML IJ SOLN: 4.0000 mg | INTRAMUSCULAR | Status: DC | PRN

## 2024-05-25 MED ORDER — DIPHENHYDRAMINE HCL 25 MG PO CAPS: 25.0000 mg | ORAL_CAPSULE | Freq: Four times a day (QID) | ORAL | Status: DC | PRN

## 2024-05-25 MED ORDER — ZOLPIDEM TARTRATE 5 MG PO TABS
5.0000 mg | ORAL_TABLET | Freq: Every evening | ORAL | Status: DC | PRN
Start: 2024-05-25 — End: 2024-05-27

## 2024-05-25 MED ORDER — OXYTOCIN 10 UNIT/ML IJ SOLN
INTRAMUSCULAR | Status: AC
Start: 2024-05-25 — End: 2024-05-25
  Administered 2024-05-25: 10 [IU]
  Filled 2024-05-25: qty 1

## 2024-05-25 MED ORDER — OXYTOCIN 10 UNIT/ML IJ SOLN: 10.0000 [IU] | Freq: Once | INTRAMUSCULAR | Status: DC

## 2024-05-25 MED ORDER — TETANUS-DIPHTH-ACELL PERTUSSIS 5-2.5-18.5 LF-MCG/0.5 IM SUSY: 0.5000 mL | PREFILLED_SYRINGE | Freq: Once | INTRAMUSCULAR | Status: DC

## 2024-05-25 MED ORDER — WITCH HAZEL-GLYCERIN EX PADS: 1.0000 | MEDICATED_PAD | CUTANEOUS | Status: DC | PRN

## 2024-05-25 MED ORDER — PRENATAL MULTIVITAMIN CH
1.0000 | ORAL_TABLET | Freq: Every day | ORAL | Status: DC
  Administered 2024-05-26: 1 via ORAL
  Filled 2024-05-25: qty 1

## 2024-05-25 MED ORDER — BENZOCAINE-MENTHOL 20-0.5 % EX AERO: 1.0000 | INHALATION_SPRAY | CUTANEOUS | Status: DC | PRN

## 2024-05-25 MED ORDER — COCONUT OIL OIL: 1.0000 | TOPICAL_OIL | Status: DC | PRN

## 2024-05-25 MED ORDER — ONDANSETRON HCL 4 MG PO TABS
4.0000 mg | ORAL_TABLET | ORAL | Status: DC | PRN
Start: 2024-05-25 — End: 2024-05-27

## 2024-05-25 MED ORDER — SENNOSIDES-DOCUSATE SODIUM 8.6-50 MG PO TABS
2.0000 | ORAL_TABLET | Freq: Every day | ORAL | Status: DC
  Administered 2024-05-26: 2 via ORAL
  Filled 2024-05-25: qty 2

## 2024-05-25 MED ORDER — SIMETHICONE 80 MG PO CHEW
80.0000 mg | CHEWABLE_TABLET | ORAL | Status: DC | PRN
Start: 2024-05-25 — End: 2024-05-27

## 2024-05-25 MED ORDER — ACETAMINOPHEN 500 MG PO TABS: 1000.0000 mg | ORAL_TABLET | Freq: Four times a day (QID) | ORAL | Status: DC | PRN

## 2024-05-25 MED ORDER — DIBUCAINE (PERIANAL) 1 % EX OINT
1.0000 | TOPICAL_OINTMENT | CUTANEOUS | Status: DC | PRN
Start: 2024-05-25 — End: 2024-05-27

## 2024-05-25 MED ORDER — IBUPROFEN 600 MG PO TABS
600.0000 mg | ORAL_TABLET | Freq: Four times a day (QID) | ORAL | Status: DC
  Administered 2024-05-26 (×2): 600 mg via ORAL
  Filled 2024-05-25 (×2): qty 1

## 2024-05-25 NOTE — Discharge Summary (Signed)
 Postpartum Discharge Summary  Date of Service updated 05/26/2024     Patient Name: [name] DOB: 01-08-1991 MRN: [id number]  Date of admission: 05/25/2024 Delivery date:05/25/2024 Delivering provider:   Date of discharge: 05/26/2024  Admitting diagnosis: Indication for care in labor and delivery, delivered [O75.9] Labor and delivery, indication for care [O75.9] Intrauterine pregnancy: [redacted]w[redacted]d     Secondary diagnosis:  Principal Problem:   Indication for care in labor and delivery, delivered Active Problems:   Trichomonal vaginitis during pregnancy   Labor and delivery, indication for care   Vaginal delivery   Cocaine use  Additional problems: None    Discharge diagnosis: Term Pregnancy Delivered and GDM A1, untreated                                               Post partum procedures:None Augmentation: none Complications: None  Hospital course: Onset of Labor With Vaginal Delivery      33 y.o. yo [id number] at [redacted]w[redacted]d was admitted after unplanned delivery at home on 05/25/2024. Labor course was complicated by precipitous labor.  Membrane Rupture Time/Date: 4:25 PM,05/25/2024  Delivery Method:Vaginal, Spontaneous Operative Delivery:N/A Episiotomy: None Lacerations:  None Patient had a postpartum course uncomplicated.  She is ambulating, tolerating a regular diet, passing flatus, and urinating well. Patient is discharged home in stable condition on 05/26/24.  Newborn Data: Birth date:05/25/2024 Birth time:4:25 PM Gender:Female Living status:Living Apgars: ,  Weight:2610 g  Magnesium  Sulfate received: No BMZ received: No Rhophylac:N/A MMR:No T-DaP:No Flu: No RSV Vaccine received: No Transfusion:No  Immunizations received: Immunization History  Administered Date(s) Administered   Influenza Split 10/14/2011   Influenza-Unspecified 07/12/2016   Tdap 10/15/2011, 12/23/2016    Physical exam  Vitals:   05/25/24 1955 05/25/24 2355 05/26/24 0350 05/26/24 1338   BP: 127/70 128/67 113/72 115/71  Pulse: 70 67 85 66  Resp: 18 18 18 18   Temp: 98.7 F (37.1 C) 98.6 F (37 C) 98.8 F (37.1 C) 98 F (36.7 C)  TempSrc: Oral Oral Oral Oral  SpO2: 100% 100% 100%   Weight:      Height:       General: alert, cooperative, and no distress Lochia: appropriate Uterine Fundus: firm Incision: N/A DVT Evaluation: No evidence of DVT seen on physical exam. Negative Homan's sign. No cords or calf tenderness. Labs: Lab Results  Component Value Date   WBC 12.1 (H) 05/25/2024   HGB 11.4 (L) 05/25/2024   HCT 36.3 05/25/2024   MCV 93.3 05/25/2024   PLT 184 05/25/2024      Latest Ref Rng & Units 11/27/2022    4:43 AM  CMP  Glucose 70 - 99 mg/dL 854   BUN 6 - 20 mg/dL 9   Creatinine 9.55 - 8.99 mg/dL 9.21   Sodium 864 - 854 mmol/L 136   Potassium 3.5 - 5.1 mmol/L 4.0   Chloride 98 - 111 mmol/L 106   CO2 22 - 32 mmol/L 22   Calcium 8.9 - 10.3 mg/dL 8.5    Edinburgh Score:    05/26/2024   11:43 AM  Edinburgh Postnatal Depression Scale Screening Tool  I have been able to laugh and see the funny side of things. 0  I have looked forward with enjoyment to things. 0  I have blamed myself unnecessarily when things went wrong. 1  I have been anxious or worried  for no good reason. 0  I have felt scared or panicky for no good reason. 0  Things have been getting on top of me. 0  I have been so unhappy that I have had difficulty sleeping. 0  I have felt sad or miserable. 0  I have been so unhappy that I have been crying. 0  The thought of harming myself has occurred to me. 0  Edinburgh Postnatal Depression Scale Total 1   Edinburgh Postnatal Depression Scale Total: 1   After visit meds:  Allergies as of 05/26/2024   No Known Allergies      Medication List     TAKE these medications    Accu-Chek Guide Test test strip Generic drug: glucose blood Please take blood sugar 4 times daily. Once when waking up before eating, and 2 hours after  breakfast, lunch, and dinner. Keep a log of all blood sugars.   Accu-Chek Guide w/Device Kit 1 Device by Does not apply route 4 (four) times daily.   Accu-Chek Softclix Lancets lancets Please take blood sugar 4 times daily. Once when waking up before eating, and 2 hours after breakfast, lunch, and dinner. Keep a log of all blood sugars.   acetaminophen  500 MG tablet Commonly known as: TYLENOL  Take 2 tablets (1,000 mg total) by mouth every 6 (six) hours as needed (for pain scale < 4).   Blood Pressure Kit Devi 1 Device by Does not apply route once a week.   coconut oil Oil Apply 1 Application topically as needed.   furosemide  20 MG tablet Commonly known as: LASIX  Take 1 tablet (20 mg total) by mouth 2 (two) times daily for 5 days.   ibuprofen  600 MG tablet Commonly known as: ADVIL  Take 1 tablet (600 mg total) by mouth every 6 (six) hours. Start taking on: May 27, 2024   potassium chloride  SA 20 MEQ tablet Commonly known as: KLOR-CON  M Take 1 tablet (20 mEq total) by mouth daily for 5 days.   prenatal vitamin w/FE, FA 27-1 MG Tabs tablet Take 1 tablet by mouth daily at 12 noon.   Vitafol  Gummies 3.33-0.333-34.8 MG Chew Chew 1 tablet by mouth daily.   witch hazel-glycerin  pad Commonly known as: TUCKS Apply 1 Application topically as needed for hemorrhoids.         Discharge home in stable condition Infant Feeding: Bottle and Breast Infant Disposition:rooming in Discharge instruction: per After Visit Summary and Postpartum booklet. Activity: Advance as tolerated. Pelvic rest for 6 weeks.  Diet: routine diet Future Appointments: Future Appointments  Date Time Provider Department Center  06/23/2024  9:15 AM Wallace Joesph LABOR, PA [facility] None   Follow up Visit:  Follow-up Information     Center for [facility]. Schedule an appointment as soon as possible for a visit in 4 week(s).   Specialty: Obstetrics and  Gynecology Why: Routine postpartum follow up in 4-6 weeks Contact information: [address] [place]  [address] [phone]               Postpartum Message sent to [facility]  Please schedule this patient for a In person postpartum visit in 4 weeks with the following provider: Any provider. Additional Postpartum F/U:none  Low risk pregnancy complicated by: NA Delivery mode:  Vaginal, Spontaneous Anticipated Birth Control: OP  IUD   05/26/2024 Mardy Shropshire, MD
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
## 2024-05-26 DIAGNOSIS — O0933 Supervision of pregnancy with insufficient antenatal care, third trimester: Secondary | ICD-10-CM | POA: Diagnosis not present

## 2024-05-26 DIAGNOSIS — F1490 Cocaine use, unspecified, uncomplicated: Secondary | ICD-10-CM | POA: Diagnosis present

## 2024-05-26 DIAGNOSIS — O99324 Drug use complicating childbirth: Secondary | ICD-10-CM | POA: Diagnosis not present

## 2024-05-26 DIAGNOSIS — O24429 Gestational diabetes mellitus in childbirth, unspecified control: Secondary | ICD-10-CM | POA: Diagnosis not present

## 2024-05-26 LAB — RPR: RPR Ser Ql: NONREACTIVE

## 2024-05-26 MED ORDER — IBUPROFEN 600 MG PO TABS: 600.0000 mg | ORAL_TABLET | Freq: Four times a day (QID) | ORAL | 0 refills | Status: AC

## 2024-05-26 MED ORDER — COCONUT OIL OIL: 1.0000 | TOPICAL_OIL | Status: AC | PRN

## 2024-05-26 MED ORDER — FUROSEMIDE 20 MG PO TABS: 20.0000 mg | ORAL_TABLET | Freq: Two times a day (BID) | ORAL | Status: DC

## 2024-05-26 MED ORDER — METRONIDAZOLE 500 MG PO TABS
2000.0000 mg | ORAL_TABLET | Freq: Once | ORAL | Status: AC
  Administered 2024-05-26: 2000 mg via ORAL
  Filled 2024-05-26: qty 4

## 2024-05-26 MED ORDER — WITCH HAZEL-GLYCERIN EX PADS: 1.0000 | MEDICATED_PAD | CUTANEOUS | Status: AC | PRN

## 2024-05-26 MED ORDER — ACETAMINOPHEN 500 MG PO TABS: 1000.0000 mg | ORAL_TABLET | Freq: Four times a day (QID) | ORAL | 0 refills | Status: AC | PRN

## 2024-05-26 MED ORDER — POTASSIUM CHLORIDE CRYS ER 20 MEQ PO TBCR
20.0000 meq | EXTENDED_RELEASE_TABLET | Freq: Every day | ORAL | 1 refills | Status: AC
Start: 2024-05-26 — End: 2024-05-31

## 2024-05-26 MED ORDER — FUROSEMIDE 20 MG PO TABS: 20.0000 mg | ORAL_TABLET | Freq: Two times a day (BID) | ORAL | 0 refills | Status: AC

## 2024-05-26 NOTE — Progress Notes (Signed)
 Patient ID: [name], female   DOB: 1990-12-20, 33 y.o.   MRN: [id number]   Post Partum Day One:S/P VD at home  Subjective: Patient up ad lib, denies syncope or dizziness. Reports consuming regular diet without issues and denies N/V. Denies issues with urination and reports bleeding is good.  Patient is bottlefeeding and denies issues with her breast.  Desires IUD for postpartum contraception.  Pain is being appropriately managed without use of medications.  Objective: Vitals:   05/25/24 1849 05/25/24 1955 05/25/24 2355 05/26/24 0350  BP: 139/84 127/70 128/67 113/72  Pulse: 78 70 67 85  Resp: 18 18 18 18   Temp: 98.1 F (36.7 C) 98.7 F (37.1 C) 98.6 F (37 C) 98.8 F (37.1 C)  TempSrc: Oral Oral Oral Oral  SpO2: 100% 100% 100% 100%  Weight:      Height:       Recent Labs    05/25/24 1955  HGB 11.4*  HCT 36.3    Physical Exam:  General: cooperative, fatigued, and no distress Mood/Affect: Appropriate/Lethargic Lungs: clear to auscultation, no wheezes, rales or rhonchi, symmetric air entry.  Heart: normal rate and regular rhythm. Breast: not examined. Abdomen:  + bowel sounds, Soft, NT Uterine Fundus: firm, +2/U, Displaced to right Lochia: appropriate Laceration: None Skin: Warm, Dry DVT Evaluation: No evidence of DVT seen on physical exam. No significant calf/ankle edema.  Assessment S/P Vaginal Delivery-Day One Normal Involution Bottle Feeding  Plan: -Discussed delivery. Informed that with evening delivery and need for SW consult, will plan for discharge tomorrow.  -Patient agreeable. -Encouraged rest. -Continue current care. -L&D team to be updated on patient status.   [name], MSN, CNM 05/26/2024, 7:16 AM

## 2024-05-26 NOTE — Progress Notes (Signed)
 Pt educated to bottle feed baby only and that breastfeeding can cause harm and even death to baby if breastfeeding with positive cocaine. Pt also educated that baby needs to feed every 3 hours and to not go over 3 hours. At 0600 feeding update, I asked pt if baby had a bottle between 0130 and 0600 and pt said 0400. There were still two bottles remaining from the 0130 feed, when asking pt about it, she said it was a breastfeed. I re-educated pt the dangers in breastfeeding and that the baby tested positive for cocaine. Pt stated that she didn't do drugs while pregnant.
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
## 2024-05-26 NOTE — Progress Notes (Signed)
 CSW has delivered [facility] CPS Social worker Antoinette to the room with MOB and the infant.  Rosina Molt, [place] Clinical Social Worker [phone]

## 2024-05-26 NOTE — Lactation Note (Signed)
 This note was copied from a baby's chart. Lactation Consultation Note  Patient Name: [name] Date: 05/26/2024 Age:32 hours  Formula feeding.   Maternal Data    Feeding    LATCH Score                    Lactation Tools Discussed/Used    Interventions    Discharge    Consult Status Consult Status: Complete    Selina Tapper G 05/26/2024, 3:36 AM

## 2024-05-27 NOTE — Social Work (Signed)
 [facility] CPS SW [place], requesting the infant remain in the hospital until Monday 8/18. Per CPS SW infant can remain in the room with MOB and CFT meeting is scheduled for Monday at 10am to discuss safe discharge plan for the infant. Barriers to infants discharge  Bobbijo Holst, [facility] Clinical Social Worker [phone]

## 2024-05-30 ENCOUNTER — Encounter: Payer: MEDICAID | Admitting: Obstetrics

## 2024-06-06 ENCOUNTER — Telehealth (HOSPITAL_COMMUNITY): Payer: Self-pay

## 2024-06-06 NOTE — Telephone Encounter (Signed)
 06/06/2024 1511  Name: AKYLAH HASCALL MRN: [id number] DOB: 1991/09/10  Reason for Call:  Transition of Care Hospital Discharge Call  Contact Status: Patient Contact Status: Message  Language assistant needed:          Follow-Up Questions:    Van Postnatal Depression Scale:  In the Past 7 Days:    PHQ2-9 Depression Scale:     Discharge Follow-up:    Post-discharge interventions: NA  Signature  Rosaline Deretha PEAK

## 2024-06-23 ENCOUNTER — Ambulatory Visit: Payer: MEDICAID | Admitting: Family Medicine

## 2024-09-24 ENCOUNTER — Emergency Department (HOSPITAL_COMMUNITY)
Admission: EM | Admit: 2024-09-24 | Discharge: 2024-09-25 | Disposition: A | Payer: MEDICAID | Attending: Emergency Medicine | Admitting: Emergency Medicine

## 2024-09-24 ENCOUNTER — Emergency Department (HOSPITAL_COMMUNITY): Payer: MEDICAID

## 2024-09-24 ENCOUNTER — Encounter (HOSPITAL_COMMUNITY): Payer: Self-pay | Admitting: *Deleted

## 2024-09-24 DIAGNOSIS — M7989 Other specified soft tissue disorders: Secondary | ICD-10-CM | POA: Insufficient documentation

## 2024-09-24 DIAGNOSIS — R4586 Emotional lability: Secondary | ICD-10-CM

## 2024-09-24 DIAGNOSIS — F39 Unspecified mood [affective] disorder: Secondary | ICD-10-CM | POA: Insufficient documentation

## 2024-09-24 DIAGNOSIS — R451 Restlessness and agitation: Secondary | ICD-10-CM | POA: Diagnosis present

## 2024-09-24 DIAGNOSIS — W228XXA Striking against or struck by other objects, initial encounter: Secondary | ICD-10-CM | POA: Insufficient documentation

## 2024-09-24 LAB — COMPREHENSIVE METABOLIC PANEL WITH GFR
ALT: 15 U/L (ref 0–44)
AST: 17 U/L (ref 15–41)
Albumin: 3.8 g/dL (ref 3.5–5.0)
Alkaline Phosphatase: 47 U/L (ref 38–126)
Anion gap: 9 (ref 5–15)
BUN: 9 mg/dL (ref 6–20)
CO2: 26 mmol/L (ref 22–32)
Calcium: 8.8 mg/dL — ABNORMAL LOW (ref 8.9–10.3)
Chloride: 104 mmol/L (ref 98–111)
Creatinine, Ser: 0.79 mg/dL (ref 0.44–1.00)
GFR, Estimated: >60 mL/min (ref >60)
Glucose, Bld: 89 mg/dL (ref 70–99)
Potassium: 3.7 mmol/L (ref 3.5–5.1)
Sodium: 139 mmol/L (ref 135–145)
Total Bilirubin: 0.5 mg/dL (ref 0.0–1.2)
Total Protein: 7.2 g/dL (ref 6.5–8.1)

## 2024-09-24 LAB — CBC
HCT: 42.3 % (ref 36.0–46.0)
Hemoglobin: 13.6 g/dL (ref 12.0–15.0)
MCH: 29.4 pg (ref 26.0–34.0)
MCHC: 32.2 g/dL (ref 30.0–36.0)
MCV: 91.4 fL (ref 80.0–100.0)
Platelets: 267 K/uL (ref 150–400)
RBC: 4.63 MIL/uL (ref 3.87–5.11)
RDW: 13.9 % (ref 11.5–15.5)
WBC: 7.5 K/uL (ref 4.0–10.5)
nRBC: 0 % (ref 0.0–0.2)

## 2024-09-24 LAB — HCG, SERUM, QUALITATIVE: Preg, Serum: NEGATIVE

## 2024-09-24 LAB — ETHANOL: Alcohol, Ethyl (B): <15 mg/dL (ref <15)
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
## 2024-09-24 NOTE — ED Notes (Signed)
 PT able to eat and drink
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
## 2024-09-24 NOTE — ED Notes (Signed)
 Pt unable to void at this time.  KM

## 2024-09-25 NOTE — ED Notes (Signed)
 Pt ambulatory out of ER with assistance of security
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
